# Patient Record
Sex: Female | Born: 1975 | Race: White | Hispanic: Yes | Marital: Single | Smoking: Never smoker
Health system: Southern US, Community
[De-identification: ages and names within clinical notes are randomized; demographics above are authoritative.]

## PROBLEM LIST (undated history)

## (undated) ENCOUNTER — Inpatient Hospital Stay (HOSPITAL_COMMUNITY): Payer: Self-pay

## (undated) DIAGNOSIS — Z309 Encounter for contraceptive management, unspecified: Secondary | ICD-10-CM

## (undated) DIAGNOSIS — B009 Herpesviral infection, unspecified: Secondary | ICD-10-CM

## (undated) DIAGNOSIS — Z331 Pregnant state, incidental: Secondary | ICD-10-CM

## (undated) DIAGNOSIS — N9089 Other specified noninflammatory disorders of vulva and perineum: Secondary | ICD-10-CM

## (undated) DIAGNOSIS — R87619 Unspecified abnormal cytological findings in specimens from cervix uteri: Secondary | ICD-10-CM

## (undated) DIAGNOSIS — O24419 Gestational diabetes mellitus in pregnancy, unspecified control: Secondary | ICD-10-CM

## (undated) DIAGNOSIS — B977 Papillomavirus as the cause of diseases classified elsewhere: Secondary | ICD-10-CM

## (undated) DIAGNOSIS — R102 Pelvic and perineal pain: Secondary | ICD-10-CM

## (undated) HISTORY — DX: Encounter for contraceptive management, unspecified: Z30.9

## (undated) HISTORY — DX: Pelvic and perineal pain: R10.2

## (undated) HISTORY — PX: CERVICAL BIOPSY: SHX590

## (undated) HISTORY — DX: Pregnant state, incidental: Z33.1

## (undated) HISTORY — PX: TONSILLECTOMY: SUR1361

## (undated) HISTORY — DX: Gestational diabetes mellitus in pregnancy, unspecified control: O24.419

## (undated) HISTORY — DX: Papillomavirus as the cause of diseases classified elsewhere: B97.7

## (undated) HISTORY — DX: Herpesviral infection, unspecified: B00.9

## (undated) HISTORY — DX: Unspecified abnormal cytological findings in specimens from cervix uteri: R87.619

## (undated) HISTORY — DX: Other specified noninflammatory disorders of vulva and perineum: N90.89

---

## 2001-03-25 ENCOUNTER — Other Ambulatory Visit: Admission: RE | Admit: 2001-03-25 | Discharge: 2001-03-25 | Payer: Self-pay | Admitting: Obstetrics and Gynecology

## 2001-08-18 ENCOUNTER — Encounter: Admission: RE | Admit: 2001-08-18 | Discharge: 2001-11-16 | Payer: Self-pay | Admitting: Obstetrics and Gynecology

## 2001-08-30 ENCOUNTER — Observation Stay (HOSPITAL_COMMUNITY): Admission: AD | Admit: 2001-08-30 | Discharge: 2001-08-31 | Payer: Self-pay | Admitting: Obstetrics and Gynecology

## 2001-09-26 ENCOUNTER — Ambulatory Visit (HOSPITAL_COMMUNITY): Admission: RE | Admit: 2001-09-26 | Discharge: 2001-09-26 | Payer: Self-pay | Admitting: Obstetrics and Gynecology

## 2001-09-29 ENCOUNTER — Ambulatory Visit (HOSPITAL_COMMUNITY): Admission: RE | Admit: 2001-09-29 | Discharge: 2001-09-29 | Payer: Self-pay | Admitting: Obstetrics and Gynecology

## 2001-10-12 ENCOUNTER — Inpatient Hospital Stay (HOSPITAL_COMMUNITY): Admission: RE | Admit: 2001-10-12 | Discharge: 2001-10-14 | Payer: Self-pay | Admitting: Obstetrics and Gynecology

## 2006-05-02 ENCOUNTER — Emergency Department (HOSPITAL_COMMUNITY): Admission: EM | Admit: 2006-05-02 | Discharge: 2006-05-02 | Payer: Self-pay | Admitting: Emergency Medicine

## 2006-05-03 ENCOUNTER — Emergency Department (HOSPITAL_COMMUNITY): Admission: EM | Admit: 2006-05-03 | Discharge: 2006-05-03 | Payer: Self-pay | Admitting: Emergency Medicine

## 2010-12-23 ENCOUNTER — Inpatient Hospital Stay (HOSPITAL_COMMUNITY): Admission: RE | Admit: 2010-12-23 | Payer: Self-pay | Source: Ambulatory Visit

## 2010-12-23 ENCOUNTER — Other Ambulatory Visit
Admission: RE | Admit: 2010-12-23 | Discharge: 2010-12-23 | Payer: Self-pay | Source: Home / Self Care | Admitting: Unknown Physician Specialty

## 2013-01-24 ENCOUNTER — Other Ambulatory Visit (HOSPITAL_COMMUNITY)
Admission: RE | Admit: 2013-01-24 | Discharge: 2013-01-24 | Disposition: A | Discharge status: Home / Self Care | Payer: Self-pay | Source: Ambulatory Visit | Attending: Unknown Physician Specialty | Admitting: Unknown Physician Specialty

## 2013-01-24 DIAGNOSIS — R87619 Unspecified abnormal cytological findings in specimens from cervix uteri: Secondary | ICD-10-CM | POA: Insufficient documentation

## 2013-01-24 DIAGNOSIS — Z01419 Encounter for gynecological examination (general) (routine) without abnormal findings: Secondary | ICD-10-CM | POA: Insufficient documentation

## 2013-09-30 HISTORY — PX: COLPOSCOPY: SHX161

## 2013-10-17 ENCOUNTER — Other Ambulatory Visit (HOSPITAL_COMMUNITY)
Admission: RE | Admit: 2013-10-17 | Discharge: 2013-10-17 | Disposition: A | Discharge status: Home / Self Care | Payer: Self-pay | Source: Ambulatory Visit | Attending: Unknown Physician Specialty | Admitting: Unknown Physician Specialty

## 2013-10-17 DIAGNOSIS — N72 Inflammatory disease of cervix uteri: Secondary | ICD-10-CM | POA: Insufficient documentation

## 2013-10-17 DIAGNOSIS — R87610 Atypical squamous cells of undetermined significance on cytologic smear of cervix (ASC-US): Secondary | ICD-10-CM | POA: Insufficient documentation

## 2014-01-12 ENCOUNTER — Encounter: Payer: Self-pay | Admitting: Adult Health

## 2014-01-12 ENCOUNTER — Encounter (INDEPENDENT_AMBULATORY_CARE_PROVIDER_SITE_OTHER): Payer: Self-pay

## 2014-01-12 ENCOUNTER — Ambulatory Visit (INDEPENDENT_AMBULATORY_CARE_PROVIDER_SITE_OTHER): Payer: 59 | Admitting: Adult Health

## 2014-01-12 VITALS — BP 90/68 | Ht 65.0 in | Wt 151.0 lb

## 2014-01-12 DIAGNOSIS — Z309 Encounter for contraceptive management, unspecified: Secondary | ICD-10-CM

## 2014-01-12 DIAGNOSIS — N9089 Other specified noninflammatory disorders of vulva and perineum: Secondary | ICD-10-CM

## 2014-01-12 DIAGNOSIS — B009 Herpesviral infection, unspecified: Secondary | ICD-10-CM

## 2014-01-12 HISTORY — DX: Encounter for contraceptive management, unspecified: Z30.9

## 2014-01-12 HISTORY — DX: Other specified noninflammatory disorders of vulva and perineum: N90.89

## 2014-01-12 MED ORDER — NORETHIN ACE-ETH ESTRAD-FE 1-20 MG-MCG(24) PO CHEW: 1.0000 | CHEWABLE_TABLET | Freq: Every day | ORAL | Status: DC

## 2014-01-12 MED ORDER — VALACYCLOVIR HCL 1 G PO TABS: ORAL_TABLET | ORAL | Status: DC

## 2014-01-12 NOTE — Progress Notes (Signed)
Subjective:     Patient ID: Jane Sherman, female   DOB: 12/09/1975, 38 y.o.   MRN: [id number]  HPI Jerene Dillingngrid is a 38 year old Hispanic female in complaining of left vulva irritation, she has history of abnormal pap, having been seen at health dept and had colpo there.She said there was a bump and she may have shaved over it, and has been putting neosporin on it.Has new partner for 2 months, and wants birth control, would like the pill.She has not smoked in over 5 years.  Review of Systems See HPI Reviewed past medical,surgical, social and family history. Reviewed medications and allergies.     Objective:   Physical Exam BP 90/68  Ht 5\' 5"  (1.651 m)  Wt 151 lb (68.493 kg)  BMI 25.13 kg/m2  LMP 12/19/2013 Skin warm and dry.Pelvic: external genitalia is normal in appearance, except for raw area left labia, herpes culture obtained, vagina: scant discharge without odor, cervix:smooth and bulbous, uterus: normal size, shape and contour, non tender, no masses felt, adnexa: no masses or tenderness noted.She wants to transfer records here from clinic.Discussed that herpes is virus and how it is spread.    Assessment:    Vulva irritation Herpes Contraceptive management    Plan:    Check HSV 2, and herpes culture Rx minastrin 1 daily start with next period, refill x 1 year USE condoms Rx valtrex 1 gm 1 bid x 10 days with 1 refill Review handout on herpes Follow up in 3 months for OC review

## 2014-01-12 NOTE — Patient Instructions (Signed)
Herpes Simplex Herpes simplex is generally classified as Type 1 or Type 2. Type 1 is generally the type that is responsible for cold sores. Type 2 is generally associated with sexually transmitted diseases. We now know that most of the thoughts on these viruses are inaccurate. We find that HSV1 is also present genitally and HSV2 can be present orally, but this will vary in different locations of the world. Herpes simplex is usually detected by doing a culture. Blood tests are also available for this virus; however, the accuracy is often not as good.  PREPARATION FOR TEST No preparation or fasting is necessary. NORMAL FINDINGS  No virus present  No HSV antigens or antibodies present Ranges for normal findings may vary among different laboratories and hospitals. You should always check with your doctor after having lab work or other tests done to discuss the meaning of your test results and whether your values are considered within normal limits. MEANING OF TEST  Your caregiver will go over the test results with you and discuss the importance and meaning of your results, as well as treatment options and the need for additional tests if necessary. OBTAINING THE TEST RESULTS  It is your responsibility to obtain your test results. Ask the lab or department performing the test when and how you will get your results. Document Released: 12/19/2004 Document Revised: 02/08/2012 Document Reviewed: 10/27/2008 [facility] Patient Information 2014 [place], [place]. Start birth control with next period Follow up in 3 months

## 2014-01-16 LAB — HSV 2 ANTIBODY, IGG: HSV 2 GLYCOPROTEIN G AB, IGG: 8.94 IV — AB

## 2014-01-17 ENCOUNTER — Telehealth: Payer: Self-pay | Admitting: Adult Health

## 2014-01-17 LAB — HERPES SIMPLEX VIRUS CULTURE: Organism ID, Bacteria: DETECTED

## 2014-01-17 NOTE — Telephone Encounter (Signed)
No voice mail box.

## 2014-01-18 ENCOUNTER — Telehealth: Payer: Self-pay | Admitting: Adult Health

## 2014-01-18 NOTE — Telephone Encounter (Signed)
Pt aware HSV 2 +antibodies and culture +HSV, feels better taking valtrex.

## 2014-04-06 ENCOUNTER — Ambulatory Visit: Payer: 59 | Admitting: Adult Health

## 2014-04-09 ENCOUNTER — Ambulatory Visit: Payer: 59 | Admitting: Adult Health

## 2014-04-09 ENCOUNTER — Encounter: Payer: Self-pay | Admitting: *Deleted

## 2014-04-17 ENCOUNTER — Ambulatory Visit (INDEPENDENT_AMBULATORY_CARE_PROVIDER_SITE_OTHER): Payer: 59 | Admitting: Adult Health

## 2014-04-17 ENCOUNTER — Encounter: Payer: Self-pay | Admitting: Adult Health

## 2014-04-17 VITALS — BP 100/60 | Ht 66.0 in | Wt 159.0 lb

## 2014-04-17 DIAGNOSIS — B009 Herpesviral infection, unspecified: Secondary | ICD-10-CM

## 2014-04-17 HISTORY — DX: Herpesviral infection, unspecified: B00.9

## 2014-04-17 MED ORDER — VALACYCLOVIR HCL 1 G PO TABS: ORAL_TABLET | ORAL | Status: DC

## 2014-04-17 NOTE — Patient Instructions (Signed)
Genital Herpes Genital herpes is a sexually transmitted disease. This means that it is a disease passed by having sex with an infected person. There is no cure for genital herpes. The time between attacks can be months to years. The virus may live in a person but produce no problems (symptoms). This infection can be passed to a baby as it travels down the birth canal (vagina). In a newborn, this can cause central nervous system damage, eye damage, or even death. The virus that causes genital herpes is usually HSV-2 virus. The virus that causes oral herpes is usually HSV-1. The diagnosis (learning what is wrong) is made through culture results. SYMPTOMS  Usually symptoms of pain and itching begin a few days to a week after contact. It first appears as small blisters that progress to small painful ulcers which then scab over and heal after several days. It affects the outer genitalia, birth canal, cervix, penis, anal area, buttocks, and thighs. HOME CARE INSTRUCTIONS   Keep ulcerated areas dry and clean.  Take medications as directed. Antiviral medications can speed up healing. They will not prevent recurrences or cure this infection. These medications can also be taken for suppression if there are frequent recurrences.  While the infection is active, it is contagious. Avoid all sexual contact during active infections.  Condoms may help prevent spread of the herpes virus.  Practice safe sex.  Wash your hands thoroughly after touching the genital area.  Avoid touching your eyes after touching your genital area.  Inform your caregiver if you have had genital herpes and become pregnant. It is your responsibility to insure a safe outcome for your baby in this pregnancy.  Only take over-the-counter or prescription medicines for pain, discomfort, or fever as directed by your caregiver. SEEK MEDICAL CARE IF:   You have a recurrence of this infection.  You do not respond to medications and are not  improving.  You have new sources of pain or discharge which have changed from the original infection.  You have an oral temperature above 102 F (38.9 C).  You develop abdominal pain.  You develop eye pain or signs of eye infection. Document Released: 11/13/2000 Document Revised: 02/08/2012 Document Reviewed: 12/04/2009 Exit[employer] Patient Information 2014 [employer]. TAKE FOLIC ACID Follow up in 6 months

## 2014-04-17 NOTE — Progress Notes (Signed)
Subjective:     Patient ID: Jane Sherman, female   DOB: 1976-06-05, 38 y.o.   MRN: [id number]  HPI Jane Sherman is a 38 year old Hispanic female in to discuss birth control.She stopped taking it, she thinks she wants a baby.  Review of Systems See  HPI Reviewed past medical,surgical, social and family history. Reviewed medications and allergies.     Objective:   Physical Exam BP 100/60  Ht 5\' 6"  (1.676 m)  Wt 159 lb (72.122 kg)  BMI 25.68 kg/m2  LMP 03/23/2014   Talk only, she stopped taking her OCs because she wants to get pregnant, has had outbreak of HSV recently, discussed taking valtrex daily and taking folic acid and timing of sex.   Assessment:    Herpes    Plan:    Rx valtrex 1 gm 1 daily #30 with prn refill Take folic acid Follow up in 6 months for pap and physical

## 2014-05-01 ENCOUNTER — Encounter: Payer: Self-pay | Admitting: *Deleted

## 2014-05-21 ENCOUNTER — Encounter: Payer: Self-pay | Admitting: Obstetrics and Gynecology

## 2014-05-22 ENCOUNTER — Telehealth: Payer: Self-pay | Admitting: *Deleted

## 2014-05-22 NOTE — Telephone Encounter (Signed)
Message copied by Criss AlvinePULLIAM, CHRYSTAL G on Tue May 22, 2014  8:39 AM ------      Message from: Tilda BurrowFERGUSON, JOHN V      Created: Mon May 21, 2014  7:58 PM       Ascus pap with + HPV , Colposcopy indicated ------

## 2014-07-03 ENCOUNTER — Ambulatory Visit: Payer: 59 | Admitting: Adult Health

## 2014-07-04 ENCOUNTER — Encounter: Payer: Self-pay | Admitting: Adult Health

## 2014-07-04 ENCOUNTER — Ambulatory Visit (INDEPENDENT_AMBULATORY_CARE_PROVIDER_SITE_OTHER): Payer: 59 | Admitting: Adult Health

## 2014-07-04 VITALS — BP 120/80 | Ht 65.0 in | Wt 162.0 lb

## 2014-07-04 DIAGNOSIS — Z331 Pregnant state, incidental: Secondary | ICD-10-CM

## 2014-07-04 DIAGNOSIS — Z3201 Encounter for pregnancy test, result positive: Secondary | ICD-10-CM

## 2014-07-04 DIAGNOSIS — R102 Pelvic and perineal pain: Secondary | ICD-10-CM

## 2014-07-04 DIAGNOSIS — N949 Unspecified condition associated with female genital organs and menstrual cycle: Secondary | ICD-10-CM

## 2014-07-04 DIAGNOSIS — Z3200 Encounter for pregnancy test, result unknown: Secondary | ICD-10-CM

## 2014-07-04 DIAGNOSIS — O26899 Other specified pregnancy related conditions, unspecified trimester: Secondary | ICD-10-CM

## 2014-07-04 HISTORY — DX: Pregnant state, incidental: Z33.1

## 2014-07-04 HISTORY — DX: Other specified pregnancy related conditions, unspecified trimester: O26.899

## 2014-07-04 HISTORY — DX: Pelvic and perineal pain: R10.2

## 2014-07-04 LAB — POCT URINE PREGNANCY: Preg Test, Ur: POSITIVE

## 2014-07-04 MED ORDER — PRENATAL PLUS 27-1 MG PO TABS: 1.0000 | ORAL_TABLET | Freq: Every day | ORAL | Status: DC

## 2014-07-04 NOTE — Patient Instructions (Signed)
First Trimester of Pregnancy The first trimester of pregnancy is from week 1 until the end of week 12 (months 1 through 3). A week after a sperm fertilizes an egg, the egg will implant on the wall of the uterus. This embryo will begin to develop into a baby. Genes from you and your partner are forming the baby. The female genes determine whether the baby is a boy or a girl. At 6-8 weeks, the eyes and face are formed, and the heartbeat can be seen on ultrasound. At the end of 12 weeks, all the baby's organs are formed.  Now that you are pregnant, you will want to do everything you can to have a healthy baby. Two of the most important things are to get good prenatal care and to follow your health care provider's instructions. Prenatal care is all the medical care you receive before the baby's birth. This care will help prevent, find, and treat any problems during the pregnancy and childbirth. BODY CHANGES Your body goes through many changes during pregnancy. The changes vary from woman to woman.   You may gain or lose a couple of pounds at first.  You may feel sick to your stomach (nauseous) and throw up (vomit). If the vomiting is uncontrollable, call your health care provider.  You may tire easily.  You may develop headaches that can be relieved by medicines approved by your health care provider.  You may urinate more often. Painful urination may mean you have a bladder infection.  You may develop heartburn as a result of your pregnancy.  You may develop constipation because certain hormones are causing the muscles that push waste through your intestines to slow down.  You may develop hemorrhoids or swollen, bulging veins (varicose veins).  Your breasts may begin to grow larger and become tender. Your nipples may stick out more, and the tissue that surrounds them (areola) may become darker.  Your gums may bleed and may be sensitive to brushing and flossing.  Dark spots or blotches (chloasma,  mask of pregnancy) may develop on your face. This will likely fade after the baby is born.  Your menstrual periods will stop.  You may have a loss of appetite.  You may develop cravings for certain kinds of food.  You may have changes in your emotions from day to day, such as being excited to be pregnant or being concerned that something may go wrong with the pregnancy and baby.  You may have more vivid and strange dreams.  You may have changes in your hair. These can include thickening of your hair, rapid growth, and changes in texture. Some women also have hair loss during or after pregnancy, or hair that feels dry or thin. Your hair will most likely return to normal after your baby is born. WHAT TO EXPECT AT YOUR PRENATAL VISITS During a routine prenatal visit:  You will be weighed to make sure you and the baby are growing normally.  Your blood pressure will be taken.  Your abdomen will be measured to track your baby's growth.  The fetal heartbeat will be listened to starting around week 10 or 12 of your pregnancy.  Test results from any previous visits will be discussed. Your health care provider may ask you:  How you are feeling.  If you are feeling the baby move.  If you have had any abnormal symptoms, such as leaking fluid, bleeding, severe headaches, or abdominal cramping.  If you have any questions. Other tests   that may be performed during your first trimester include:  Blood tests to find your blood type and to check for the presence of any previous infections. They will also be used to check for low iron levels (anemia) and Rh antibodies. Later in the pregnancy, blood tests for diabetes will be done along with other tests if problems develop.  Urine tests to check for infections, diabetes, or protein in the urine.  An ultrasound to confirm the proper growth and development of the baby.  An amniocentesis to check for possible genetic problems.  Fetal screens for  spina bifida and Down syndrome.  You may need other tests to make sure you and the baby are doing well. HOME CARE INSTRUCTIONS  Medicines  Follow your health care provider's instructions regarding medicine use. Specific medicines may be either safe or unsafe to take during pregnancy.  Take your prenatal vitamins as directed.  If you develop constipation, try taking a stool softener if your health care provider approves. Diet  Eat regular, well-balanced meals. Choose a variety of foods, such as meat or vegetable-based protein, fish, milk and low-fat dairy products, vegetables, fruits, and whole grain breads and cereals. Your health care provider will help you determine the amount of weight gain that is right for you.  Avoid raw meat and uncooked cheese. These carry germs that can cause birth defects in the baby.  Eating four or five small meals rather than three large meals a day may help relieve nausea and vomiting. If you start to feel nauseous, eating a few soda crackers can be helpful. Drinking liquids between meals instead of during meals also seems to help nausea and vomiting.  If you develop constipation, eat more high-fiber foods, such as fresh vegetables or fruit and whole grains. Drink enough fluids to keep your urine clear or pale yellow. Activity and Exercise  Exercise only as directed by your health care provider. Exercising will help you:  Control your weight.  Stay in shape.  Be prepared for labor and delivery.  Experiencing pain or cramping in the lower abdomen or low back is a good sign that you should stop exercising. Check with your health care provider before continuing normal exercises.  Try to avoid standing for long periods of time. Move your legs often if you must stand in one place for a long time.  Avoid heavy lifting.  Wear low-heeled shoes, and practice good posture.  You may continue to have sex unless your health care provider directs you  otherwise. Relief of Pain or Discomfort  Wear a good support bra for breast tenderness.   Take warm sitz baths to soothe any pain or discomfort caused by hemorrhoids. Use hemorrhoid cream if your health care provider approves.   Rest with your legs elevated if you have leg cramps or low back pain.  If you develop varicose veins in your legs, wear support hose. Elevate your feet for 15 minutes, 3-4 times a day. Limit salt in your diet. Prenatal Care  Schedule your prenatal visits by the twelfth week of pregnancy. They are usually scheduled monthly at first, then more often in the last 2 months before delivery.  Write down your questions. Take them to your prenatal visits.  Keep all your prenatal visits as directed by your health care provider. Safety  Wear your seat belt at all times when driving.  Make a list of emergency phone numbers, including numbers for family, friends, the hospital, and police and fire departments. General Tips    Ask your health care provider for a referral to a local prenatal education class. Begin classes no later than at the beginning of month 6 of your pregnancy.  Ask for help if you have counseling or nutritional needs during pregnancy. Your health care provider can offer advice or refer you to specialists for help with various needs.  Do not use hot tubs, steam rooms, or saunas.  Do not douche or use tampons or scented sanitary pads.  Do not cross your legs for long periods of time.  Avoid cat litter boxes and soil used by cats. These carry germs that can cause birth defects in the baby and possibly loss of the fetus by miscarriage or stillbirth.  Avoid all smoking, herbs, alcohol, and medicines not prescribed by your health care provider. Chemicals in these affect the formation and growth of the baby.  Schedule a dentist appointment. At home, brush your teeth with a soft toothbrush and be gentle when you floss. SEEK MEDICAL CARE IF:   You have  dizziness.  You have mild pelvic cramps, pelvic pressure, or nagging pain in the abdominal area.  You have persistent nausea, vomiting, or diarrhea.  You have a bad smelling vaginal discharge.  You have pain with urination.  You notice increased swelling in your face, hands, legs, or ankles. SEEK IMMEDIATE MEDICAL CARE IF:   You have a fever.  You are leaking fluid from your vagina.  You have spotting or bleeding from your vagina.  You have severe abdominal cramping or pain.  You have rapid weight gain or loss.  You vomit blood or material that looks like coffee grounds.  You are exposed to [place] measles and have never had them.  You are exposed to fifth disease or chickenpox.  You develop a severe headache.  You have shortness of breath.  You have any kind of trauma, such as from a fall or a car accident. Document Released: 11/10/2001 Document Revised: 04/02/2014 Document Reviewed: 09/26/2013 [facility]Exit[facility] Patient Information 2015 [place]ExitCare, [place]LLC. This information is not intended to replace advice given to you by your health care provider. Make sure you discuss any questions you have with your health care provider. No sex with pain Return in 2 weeks for US No lifting over 20 lbs

## 2014-07-04 NOTE — Progress Notes (Signed)
Subjective:     Patient ID: Jane Sherman, female   DOB: March 26, 1976, 38 y.o.   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  Review of Systems See HPI Reviewed past medical,surgical, social and family history. Reviewed medications and allergies.      Objective:   Physical Exam BP 120/80  Ht 5\' 5"  (1.651 m)  Wt 162 lb (73.483 kg)  BMI 26.96 kg/m2  LMP 07/01/2015UPT +. Skin warm and dry.Pelvic: external genitalia is normal in appearance, no lesions, vagina: white discharge without odor, no bleeding, cervix:smooth and bulbous, uterus: normal size, shape and contour, mildly tender, no masses felt, adnexa: no masses or tenderness noted. Discussed could be uterus stretching,but good that there is no bleeding.    Assessment:     Pelvic pain in pregnant pt., first trimester    Plan:    No sex while has pain, use tylenol for pain and increase fluids Rx prenatal plus #30 1 daily with 11 refills Check QHCG, call tomorrow for results Return in 2 weeks for dating US and see me Gave note to limit lifting to less than 20 lbs, OK to drive fork lift   Review handout on first tirmester

## 2014-07-05 ENCOUNTER — Telehealth: Payer: Self-pay | Admitting: Obstetrics & Gynecology

## 2014-07-05 LAB — HCG, QUANTITATIVE, PREGNANCY: HCG, BETA CHAIN, QUANT, S: 2085 m[IU]/mL

## 2014-07-05 NOTE — Telephone Encounter (Signed)
Pt aware of results. And advised to keep appointment that was already scheduled

## 2014-07-10 ENCOUNTER — Ambulatory Visit: Payer: 59 | Admitting: Adult Health

## 2014-07-11 ENCOUNTER — Telehealth: Payer: Self-pay | Admitting: Adult Health

## 2014-07-11 NOTE — Telephone Encounter (Signed)
Wants note to only work 48 hours per week, her work is going to 72 hours per week, 6 12 hour shifts, and pt is pregnant, will give note.

## 2014-07-13 ENCOUNTER — Encounter: Payer: Self-pay | Admitting: *Deleted

## 2014-07-13 NOTE — Progress Notes (Signed)
Patient ID: Jane Sherman, female   DOB: 30-Aug-1976, 38 y.o.   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 Per Cyril MourningJennifer Griffin, NP ok to give pt note stating only can work 48/12 hour shifts.

## 2014-07-17 ENCOUNTER — Encounter: Payer: 59 | Admitting: Adult Health

## 2014-07-17 ENCOUNTER — Other Ambulatory Visit: Payer: 59

## 2014-07-23 ENCOUNTER — Other Ambulatory Visit: Payer: Self-pay | Admitting: Adult Health

## 2014-07-23 ENCOUNTER — Ambulatory Visit: Payer: 59 | Admitting: Obstetrics and Gynecology

## 2014-07-23 ENCOUNTER — Encounter: Payer: 59 | Admitting: Adult Health

## 2014-07-23 ENCOUNTER — Ambulatory Visit (INDEPENDENT_AMBULATORY_CARE_PROVIDER_SITE_OTHER): Payer: 59

## 2014-07-23 ENCOUNTER — Encounter: Payer: Self-pay | Admitting: Adult Health

## 2014-07-23 DIAGNOSIS — R102 Pelvic and perineal pain: Secondary | ICD-10-CM

## 2014-07-23 DIAGNOSIS — Z331 Pregnant state, incidental: Secondary | ICD-10-CM

## 2014-07-23 DIAGNOSIS — Z1389 Encounter for screening for other disorder: Secondary | ICD-10-CM

## 2014-07-23 DIAGNOSIS — O26899 Other specified pregnancy related conditions, unspecified trimester: Secondary | ICD-10-CM

## 2014-07-23 DIAGNOSIS — N949 Unspecified condition associated with female genital organs and menstrual cycle: Secondary | ICD-10-CM

## 2014-07-23 NOTE — Progress Notes (Signed)
U/S-single IUP with +FCA noted, FHR-141 bpm, cx appears closed, bilateral adnexa appears WNL, CRL c/w 6+5wks EDD 03/13/2015

## 2014-07-30 ENCOUNTER — Encounter: Payer: Self-pay | Admitting: Women's Health

## 2014-07-30 ENCOUNTER — Ambulatory Visit (INDEPENDENT_AMBULATORY_CARE_PROVIDER_SITE_OTHER): Payer: 59 | Admitting: Women's Health

## 2014-07-30 VITALS — BP 110/72 | Wt 167.0 lb

## 2014-07-30 DIAGNOSIS — Z331 Pregnant state, incidental: Secondary | ICD-10-CM

## 2014-07-30 DIAGNOSIS — Z1389 Encounter for screening for other disorder: Secondary | ICD-10-CM

## 2014-07-30 DIAGNOSIS — O09529 Supervision of elderly multigravida, unspecified trimester: Secondary | ICD-10-CM | POA: Insufficient documentation

## 2014-07-30 DIAGNOSIS — O09899 Supervision of other high risk pregnancies, unspecified trimester: Secondary | ICD-10-CM | POA: Insufficient documentation

## 2014-07-30 DIAGNOSIS — Z3480 Encounter for supervision of other normal pregnancy, unspecified trimester: Secondary | ICD-10-CM

## 2014-07-30 DIAGNOSIS — O09521 Supervision of elderly multigravida, first trimester: Secondary | ICD-10-CM

## 2014-07-30 DIAGNOSIS — Z1159 Encounter for screening for other viral diseases: Secondary | ICD-10-CM

## 2014-07-30 DIAGNOSIS — O09291 Supervision of pregnancy with other poor reproductive or obstetric history, first trimester: Secondary | ICD-10-CM

## 2014-07-30 DIAGNOSIS — R768 Other specified abnormal immunological findings in serum: Secondary | ICD-10-CM

## 2014-07-30 DIAGNOSIS — Z36 Encounter for antenatal screening of mother: Secondary | ICD-10-CM

## 2014-07-30 DIAGNOSIS — B009 Herpesviral infection, unspecified: Secondary | ICD-10-CM | POA: Insufficient documentation

## 2014-07-30 DIAGNOSIS — Z8632 Personal history of gestational diabetes: Secondary | ICD-10-CM | POA: Insufficient documentation

## 2014-07-30 DIAGNOSIS — Z0184 Encounter for antibody response examination: Secondary | ICD-10-CM

## 2014-07-30 DIAGNOSIS — Z3481 Encounter for supervision of other normal pregnancy, first trimester: Secondary | ICD-10-CM

## 2014-07-30 DIAGNOSIS — Z8742 Personal history of other diseases of the female genital tract: Secondary | ICD-10-CM | POA: Insufficient documentation

## 2014-07-30 DIAGNOSIS — Z0189 Encounter for other specified special examinations: Secondary | ICD-10-CM

## 2014-07-30 DIAGNOSIS — Z130 Encounter for screening for diseases of the blood and blood-forming organs and certain disorders involving the immune mechanism: Secondary | ICD-10-CM

## 2014-07-30 DIAGNOSIS — Z1371 Encounter for nonprocreative screening for genetic disease carrier status: Secondary | ICD-10-CM

## 2014-07-30 LAB — POCT URINALYSIS DIPSTICK
Blood, UA: NEGATIVE
Glucose, UA: NEGATIVE
Ketones, UA: NEGATIVE
LEUKOCYTES UA: NEGATIVE
NITRITE UA: NEGATIVE
PROTEIN UA: NEGATIVE

## 2014-07-30 LAB — CBC
HEMATOCRIT: 39.4 % (ref 36.0–46.0)
HEMOGLOBIN: 13.2 g/dL (ref 12.0–15.0)
MCH: 31.4 pg (ref 26.0–34.0)
MCHC: 33.5 g/dL (ref 30.0–36.0)
MCV: 93.6 fL (ref 78.0–100.0)
Platelets: 261 10*3/uL (ref 150–400)
RBC: 4.21 MIL/uL (ref 3.87–5.11)
RDW: 13.6 % (ref 11.5–15.5)
WBC: 9.3 10*3/uL (ref 4.0–10.5)

## 2014-07-30 NOTE — Patient Instructions (Addendum)
You will have your sugar test next visit.  Please do not eat or drink anything after midnight the night before you come, not even water.  You will be here for at least two hours.     Nausea & Vomiting  Have saltine crackers or pretzels by your bed and eat a few bites before you raise your head out of bed in the morning  Eat small frequent meals throughout the day instead of large meals  Drink plenty of fluids throughout the day to stay hydrated, just don't drink a lot of fluids with your meals.  This can make your stomach fill up faster making you feel sick  Do not brush your teeth right after you eat  Products with real ginger are good for nausea, like ginger ale and ginger hard candy Make sure it says made with real ginger!  Sucking on sour candy like lemon heads is also good for nausea  If your prenatal vitamins make you nauseated, take them at night so you will sleep through the nausea  Sea Bands  If you feel like you need medicine for the nausea & vomiting please let us know  If you are unable to keep any fluids or food down please let us know    First Trimester of Pregnancy The first trimester of pregnancy is from week 1 until the end of week 12 (months 1 through 3). A week after a sperm fertilizes an egg, the egg will implant on the wall of the uterus. This embryo will begin to develop into a baby. Genes from you and your partner are forming the baby. The female genes determine whether the baby is a boy or a girl. At 6-8 weeks, the eyes and face are formed, and the heartbeat can be seen on ultrasound. At the end of 12 weeks, all the baby's organs are formed.  Now that you are pregnant, you will want to do everything you can to have a healthy baby. Two of the most important things are to get good prenatal care and to follow your health care provider's instructions. Prenatal care is all the medical care you receive before the baby's birth. This care will help prevent, find, and treat  any problems during the pregnancy and childbirth. BODY CHANGES Your body goes through many changes during pregnancy. The changes vary from woman to woman.   You may gain or lose a couple of pounds at first.  You may feel sick to your stomach (nauseous) and throw up (vomit). If the vomiting is uncontrollable, call your health care provider.  You may tire easily.  You may develop headaches that can be relieved by medicines approved by your health care provider.  You may urinate more often. Painful urination may mean you have a bladder infection.  You may develop heartburn as a result of your pregnancy.  You may develop constipation because certain hormones are causing the muscles that push waste through your intestines to slow down.  You may develop hemorrhoids or swollen, bulging veins (varicose veins).  Your breasts may begin to grow larger and become tender. Your nipples may stick out more, and the tissue that surrounds them (areola) may become darker.  Your gums may bleed and may be sensitive to brushing and flossing.  Dark spots or blotches (chloasma, mask of pregnancy) may develop on your face. This will likely fade after the baby is born.  Your menstrual periods will stop.  You may have a loss of appetite.    You may develop cravings for certain kinds of food.  You may have changes in your emotions from day to day, such as being excited to be pregnant or being concerned that something may go wrong with the pregnancy and baby.  You may have more vivid and strange dreams.  You may have changes in your hair. These can include thickening of your hair, rapid growth, and changes in texture. Some women also have hair loss during or after pregnancy, or hair that feels dry or thin. Your hair will most likely return to normal after your baby is born. WHAT TO EXPECT AT YOUR PRENATAL VISITS During a routine prenatal visit:  You will be weighed to make sure you and the baby are growing  normally.  Your blood pressure will be taken.  Your abdomen will be measured to track your baby's growth.  The fetal heartbeat will be listened to starting around week 10 or 12 of your pregnancy.  Test results from any previous visits will be discussed. Your health care provider may ask you:  How you are feeling.  If you are feeling the baby move.  If you have had any abnormal symptoms, such as leaking fluid, bleeding, severe headaches, or abdominal cramping.  If you have any questions. Other tests that may be performed during your first trimester include:  Blood tests to find your blood type and to check for the presence of any previous infections. They will also be used to check for low iron levels (anemia) and Rh antibodies. Later in the pregnancy, blood tests for diabetes will be done along with other tests if problems develop.  Urine tests to check for infections, diabetes, or protein in the urine.  An ultrasound to confirm the proper growth and development of the baby.  An amniocentesis to check for possible genetic problems.  Fetal screens for spina bifida and Down syndrome.  You may need other tests to make sure you and the baby are doing well. HOME CARE INSTRUCTIONS  Medicines  Follow your health care provider's instructions regarding medicine use. Specific medicines may be either safe or unsafe to take during pregnancy.  Take your prenatal vitamins as directed.  If you develop constipation, try taking a stool softener if your health care provider approves. Diet  Eat regular, well-balanced meals. Choose a variety of foods, such as meat or vegetable-based protein, fish, milk and low-fat dairy products, vegetables, fruits, and whole grain breads and cereals. Your health care provider will help you determine the amount of weight gain that is right for you.  Avoid raw meat and uncooked cheese. These carry germs that can cause birth defects in the baby.  Eating four  or five small meals rather than three large meals a day may help relieve nausea and vomiting. If you start to feel nauseous, eating a few soda crackers can be helpful. Drinking liquids between meals instead of during meals also seems to help nausea and vomiting.  If you develop constipation, eat more high-fiber foods, such as fresh vegetables or fruit and whole grains. Drink enough fluids to keep your urine clear or pale yellow. Activity and Exercise  Exercise only as directed by your health care provider. Exercising will help you:  Control your weight.  Stay in shape.  Be prepared for labor and delivery.  Experiencing pain or cramping in the lower abdomen or low back is a good sign that you should stop exercising. Check with your health care provider before continuing normal exercises.    Try to avoid standing for long periods of time. Move your legs often if you must stand in one place for a long time.  Avoid heavy lifting.  Wear low-heeled shoes, and practice good posture.  You may continue to have sex unless your health care provider directs you otherwise. Relief of Pain or Discomfort  Wear a good support bra for breast tenderness.   Take warm sitz baths to soothe any pain or discomfort caused by hemorrhoids. Use hemorrhoid cream if your health care provider approves.   Rest with your legs elevated if you have leg cramps or low back pain.  If you develop varicose veins in your legs, wear support hose. Elevate your feet for 15 minutes, 3-4 times a day. Limit salt in your diet. Prenatal Care  Schedule your prenatal visits by the twelfth week of pregnancy. They are usually scheduled monthly at first, then more often in the last 2 months before delivery.  Write down your questions. Take them to your prenatal visits.  Keep all your prenatal visits as directed by your health care provider. Safety  Wear your seat belt at all times when driving.  Make a list of emergency phone  numbers, including numbers for family, friends, the hospital, and police and fire departments. General Tips  Ask your health care provider for a referral to a local prenatal education class. Begin classes no later than at the beginning of month 6 of your pregnancy.  Ask for help if you have counseling or nutritional needs during pregnancy. Your health care provider can offer advice or refer you to specialists for help with various needs.  Do not use hot tubs, steam rooms, or saunas.  Do not douche or use tampons or scented sanitary pads.  Do not cross your legs for long periods of time.  Avoid cat litter boxes and soil used by cats. These carry germs that can cause birth defects in the baby and possibly loss of the fetus by miscarriage or stillbirth.  Avoid all smoking, herbs, alcohol, and medicines not prescribed by your health care provider. Chemicals in these affect the formation and growth of the baby.  Schedule a dentist appointment. At home, brush your teeth with a soft toothbrush and be gentle when you floss. SEEK MEDICAL CARE IF:   You have dizziness.  You have mild pelvic cramps, pelvic pressure, or nagging pain in the abdominal area.  You have persistent nausea, vomiting, or diarrhea.  You have a bad smelling vaginal discharge.  You have pain with urination.  You notice increased swelling in your face, hands, legs, or ankles. SEEK IMMEDIATE MEDICAL CARE IF:   You have a fever.  You are leaking fluid from your vagina.  You have spotting or bleeding from your vagina.  You have severe abdominal cramping or pain.  You have rapid weight gain or loss.  You vomit blood or material that looks like coffee grounds.  You are exposed to German measles and have never had them.  You are exposed to fifth disease or chickenpox.  You develop a severe headache.  You have shortness of breath.  You have any kind of trauma, such as from a fall or a car accident. Document  Released: 11/10/2001 Document Revised: 04/02/2014 Document Reviewed: 09/26/2013 [employer] Patient Information 2015 [employer]. This information is not intended to replace advice given to you by your health care provider. Make sure you discuss any questions you have with your health care provider.   

## 2014-07-30 NOTE — Progress Notes (Signed)
Subjective:  Jane Sherman is a 38 y.o. [phone] Hispanic female at [redacted]w[redacted]d by 6wk u/s, being seen today for her first obstetrical visit.  Her obstetrical history is significant for AMA, h/o A1DM prior pregnancy, known HSV2, abnormal pap.  Pregnancy history fully reviewed.  Patient reports some nause- no vomiting- denies needs for meds at this time. Denies vb, cramping, uti s/s, abnormal/malodorous vag d/c, or vulvovaginal itching/irritation.  BP 110/72  Wt 167 lb (75.751 kg)  LMP 05/30/2014  HISTORY: OB History  Gravida Para Term Preterm AB SAB TAB Ectopic Multiple Living  0 2 2 0 0 0 1    # Outcome Date GA Lbr Len/2nd Weight Sex Delivery Anes PTL Lv  4 CUR           3 SAB 04/30/12 [redacted]w[redacted]d         2 SAB 06/30/10 [redacted]w[redacted]d         1 TRM 10/12/01 [redacted]w[redacted]d  8 lb 9 oz (3.884 kg) F SVD  N Y     Past Medical History  Diagnosis Date  . Abnormal Pap smear of cervix   . HPV (human papilloma virus) infection   . Gestational diabetes   . Labial irritation 01/12/2014    ?herpes  . Contraceptive management 01/12/2014  . Herpes 04/17/2014  . Pregnant state, incidental 07/04/2014  . Pelvic pain in pregnant patient at less than [redacted] weeks gestation 07/04/2014   Past Surgical History  Procedure Laterality Date  . Colposcopy  09/2013  . Cervical biopsy     Family History  Problem Relation Age of Onset  . Diabetes Mother   . Diabetes Maternal Grandmother     Exam   System:     General: Well developed & nourished, no acute distress   Skin: Warm & dry, normal coloration and turgor, no rashes   Neurologic: Alert & oriented, normal mood   Cardiovascular: Regular rate & rhythm   Respiratory: Effort & rate normal, LCTAB, acyanotic   Abdomen: Soft, non tender   Extremities: normal strength, tone   Pelvic Exam:    Perineum: Normal perineum   Vulva: Normal, no lesions   Vagina:  Normal mucosa, normal discharge   Cervix: Normal, bulbous, appears closed   Uterus: Normal size/shape/contour for  GA   Thin prep pap smear not obtained today, will need after 10/17/14 (85yr from last pap)  FHR: 170 via informal transabdominal u/s   Assessment:   Pregnancy: [id number] Patient Active Problem List   Diagnosis Date Noted  . Supervision of other normal pregnancy 07/30/2014    Priority: High  . 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 History of gestational diabetes in prior pregnancy, currently pregnant 07/30/2014    Priority: High  . History of abnormal cervical Pap smear 07/30/2014    Priority: High  . Pelvic pain in pregnant patient at less than [redacted] weeks gestation 07/04/2014  . Labial irritation 01/12/2014    [redacted]w[redacted]d [id number] New OB visit AMA H/O A1DM Known HSV2 LSIL/HPV pap 2014    Plan:  Initial labs drawn Continue prenatal vitamins Problem list reviewed and updated Reviewed n/v relief measures and warning s/s to report Reviewed recommended weight gain based on pre-gravid BMI Encouraged well-balanced diet Genetic Screening discussed Integrated Screen: requested Cystic fibrosis screening discussed requested Ultrasound discussed; fetal survey: requested Follow up in 1 weeks for early 2hr gtt, then 4wks for 1st it/nt and lrob CCNC completed Pap after 10/17/14  Marge Duncans CNM,  WHNP-BC 07/30/2014 10:15 AM

## 2014-07-31 LAB — URINALYSIS, ROUTINE W REFLEX MICROSCOPIC
BILIRUBIN URINE: NEGATIVE
Glucose, UA: NEGATIVE mg/dL
HGB URINE DIPSTICK: NEGATIVE
KETONES UR: NEGATIVE mg/dL
Leukocytes, UA: NEGATIVE
Nitrite: NEGATIVE
PH: 6 (ref 5.0–8.0)
Protein, ur: NEGATIVE mg/dL
SPECIFIC GRAVITY, URINE: 1.013 (ref 1.005–1.030)
UROBILINOGEN UA: 0.2 mg/dL (ref 0.0–1.0)

## 2014-07-31 LAB — DRUG SCREEN, URINE, NO CONFIRMATION
Amphetamine Screen, Ur: NEGATIVE
BARBITURATE QUANT UR: NEGATIVE
Benzodiazepines.: NEGATIVE
COCAINE METABOLITES: NEGATIVE
CREATININE, U: 118.8 mg/dL
METHADONE: NEGATIVE
Marijuana Metabolite: NEGATIVE
OPIATE SCREEN, URINE: NEGATIVE
Phencyclidine (PCP): NEGATIVE
Propoxyphene: NEGATIVE

## 2014-07-31 LAB — VARICELLA ZOSTER ANTIBODY, IGG: VARICELLA IGG: 943.6 {index} — AB (ref <135.00)

## 2014-07-31 LAB — RUBELLA SCREEN: RUBELLA: 6.73 {index} — AB (ref <0.90)

## 2014-07-31 LAB — GC/CHLAMYDIA PROBE AMP
CT PROBE, AMP APTIMA: NEGATIVE
GC Probe RNA: NEGATIVE

## 2014-07-31 LAB — OXYCODONE SCREEN, UA, RFLX CONFIRM: OXYCODONE SCRN UR: NEGATIVE ng/mL

## 2014-07-31 LAB — SICKLE CELL SCREEN: SICKLE CELL SCREEN: NEGATIVE

## 2014-07-31 LAB — RPR

## 2014-07-31 LAB — HEPATITIS B SURFACE ANTIGEN: Hepatitis B Surface Ag: NEGATIVE

## 2014-07-31 LAB — ABO AND RH: RH TYPE: POSITIVE

## 2014-07-31 LAB — HIV ANTIBODY (ROUTINE TESTING W REFLEX): HIV 1&2 Ab, 4th Generation: NONREACTIVE

## 2014-07-31 LAB — ANTIBODY SCREEN: ANTIBODY SCREEN: NEGATIVE

## 2014-08-01 ENCOUNTER — Encounter: Payer: Self-pay | Admitting: Women's Health

## 2014-08-01 LAB — URINE CULTURE
Colony Count: NO GROWTH
ORGANISM ID, BACTERIA: NO GROWTH

## 2014-08-01 LAB — CYSTIC FIBROSIS DIAGNOSTIC STUDY

## 2014-08-04 ENCOUNTER — Encounter: Payer: Self-pay | Admitting: Women's Health

## 2014-08-07 ENCOUNTER — Other Ambulatory Visit: Payer: 59

## 2014-08-07 DIAGNOSIS — O09291 Supervision of pregnancy with other poor reproductive or obstetric history, first trimester: Secondary | ICD-10-CM

## 2014-08-07 DIAGNOSIS — Z8632 Personal history of gestational diabetes: Principal | ICD-10-CM

## 2014-08-08 ENCOUNTER — Encounter: Payer: Self-pay | Admitting: Women's Health

## 2014-08-08 LAB — GLUCOSE TOLERANCE, 2 HOURS W/ 1HR
GLUCOSE, FASTING: 79 mg/dL (ref 70–99)
Glucose, 1 hour: 159 mg/dL (ref 70–170)
Glucose, 2 hour: 141 mg/dL — ABNORMAL HIGH (ref 70–139)

## 2014-08-27 ENCOUNTER — Ambulatory Visit (INDEPENDENT_AMBULATORY_CARE_PROVIDER_SITE_OTHER): Payer: 59 | Admitting: Women's Health

## 2014-08-27 ENCOUNTER — Ambulatory Visit (INDEPENDENT_AMBULATORY_CARE_PROVIDER_SITE_OTHER): Payer: 59

## 2014-08-27 ENCOUNTER — Encounter: Payer: Self-pay | Admitting: Women's Health

## 2014-08-27 VITALS — BP 116/72 | Temp 98.1°F | Wt 174.0 lb

## 2014-08-27 DIAGNOSIS — Z36 Encounter for antenatal screening of mother: Secondary | ICD-10-CM

## 2014-08-27 DIAGNOSIS — Z331 Pregnant state, incidental: Secondary | ICD-10-CM

## 2014-08-27 DIAGNOSIS — O09521 Supervision of elderly multigravida, first trimester: Secondary | ICD-10-CM

## 2014-08-27 DIAGNOSIS — O09529 Supervision of elderly multigravida, unspecified trimester: Secondary | ICD-10-CM

## 2014-08-27 DIAGNOSIS — Z3481 Encounter for supervision of other normal pregnancy, first trimester: Secondary | ICD-10-CM

## 2014-08-27 DIAGNOSIS — Z1389 Encounter for screening for other disorder: Secondary | ICD-10-CM

## 2014-08-27 DIAGNOSIS — Z3480 Encounter for supervision of other normal pregnancy, unspecified trimester: Secondary | ICD-10-CM

## 2014-08-27 LAB — POCT URINALYSIS DIPSTICK
Glucose, UA: NEGATIVE
KETONES UA: NEGATIVE
Leukocytes, UA: NEGATIVE
Nitrite, UA: NEGATIVE
Protein, UA: NEGATIVE
RBC UA: NEGATIVE

## 2014-08-27 NOTE — Patient Instructions (Signed)
First Trimester of Pregnancy The first trimester of pregnancy is from week 1 until the end of week 12 (months 1 through 3). A week after a sperm fertilizes an egg, the egg will implant on the wall of the uterus. This embryo will begin to develop into a baby. Genes from you and your partner are forming the baby. The female genes determine whether the baby is a boy or a girl. At 6-8 weeks, the eyes and face are formed, and the heartbeat can be seen on ultrasound. At the end of 12 weeks, all the baby's organs are formed.  Now that you are pregnant, you will want to do everything you can to have a healthy baby. Two of the most important things are to get good prenatal care and to follow your health care provider's instructions. Prenatal care is all the medical care you receive before the baby's birth. This care will help prevent, find, and treat any problems during the pregnancy and childbirth. BODY CHANGES Your body goes through many changes during pregnancy. The changes vary from woman to woman.   You may gain or lose a couple of pounds at first.  You may feel sick to your stomach (nauseous) and throw up (vomit). If the vomiting is uncontrollable, call your health care provider.  You may tire easily.  You may develop headaches that can be relieved by medicines approved by your health care provider.  You may urinate more often. Painful urination may mean you have a bladder infection.  You may develop heartburn as a result of your pregnancy.  You may develop constipation because certain hormones are causing the muscles that push waste through your intestines to slow down.  You may develop hemorrhoids or swollen, bulging veins (varicose veins).  Your breasts may begin to grow larger and become tender. Your nipples may stick out more, and the tissue that surrounds them (areola) may become darker.  Your gums may bleed and may be sensitive to brushing and flossing.  Dark spots or blotches (chloasma,  mask of pregnancy) may develop on your face. This will likely fade after the baby is born.  Your menstrual periods will stop.  You may have a loss of appetite.  You may develop cravings for certain kinds of food.  You may have changes in your emotions from day to day, such as being excited to be pregnant or being concerned that something may go wrong with the pregnancy and baby.  You may have more vivid and strange dreams.  You may have changes in your hair. These can include thickening of your hair, rapid growth, and changes in texture. Some women also have hair loss during or after pregnancy, or hair that feels dry or thin. Your hair will most likely return to normal after your baby is born. WHAT TO EXPECT AT YOUR PRENATAL VISITS During a routine prenatal visit:  You will be weighed to make sure you and the baby are growing normally.  Your blood pressure will be taken.  Your abdomen will be measured to track your baby's growth.  The fetal heartbeat will be listened to starting around week 10 or 12 of your pregnancy.  Test results from any previous visits will be discussed. Your health care provider may ask you:  How you are feeling.  If you are feeling the baby move.  If you have had any abnormal symptoms, such as leaking fluid, bleeding, severe headaches, or abdominal cramping.  If you have any questions. Other tests   that may be performed during your first trimester include:  Blood tests to find your blood type and to check for the presence of any previous infections. They will also be used to check for low iron levels (anemia) and Rh antibodies. Later in the pregnancy, blood tests for diabetes will be done along with other tests if problems develop.  Urine tests to check for infections, diabetes, or protein in the urine.  An ultrasound to confirm the proper growth and development of the baby.  An amniocentesis to check for possible genetic problems.  Fetal screens for  spina bifida and Down syndrome.  You may need other tests to make sure you and the baby are doing well. HOME CARE INSTRUCTIONS  Medicines  Follow your health care provider's instructions regarding medicine use. Specific medicines may be either safe or unsafe to take during pregnancy.  Take your prenatal vitamins as directed.  If you develop constipation, try taking a stool softener if your health care provider approves. Diet  Eat regular, well-balanced meals. Choose a variety of foods, such as meat or vegetable-based protein, fish, milk and low-fat dairy products, vegetables, fruits, and whole grain breads and cereals. Your health care provider will help you determine the amount of weight gain that is right for you.  Avoid raw meat and uncooked cheese. These carry germs that can cause birth defects in the baby.  Eating four or five small meals rather than three large meals a day may help relieve nausea and vomiting. If you start to feel nauseous, eating a few soda crackers can be helpful. Drinking liquids between meals instead of during meals also seems to help nausea and vomiting.  If you develop constipation, eat more high-fiber foods, such as fresh vegetables or fruit and whole grains. Drink enough fluids to keep your urine clear or pale yellow. Activity and Exercise  Exercise only as directed by your health care provider. Exercising will help you:  Control your weight.  Stay in shape.  Be prepared for labor and delivery.  Experiencing pain or cramping in the lower abdomen or low back is a good sign that you should stop exercising. Check with your health care provider before continuing normal exercises.  Try to avoid standing for long periods of time. Move your legs often if you must stand in one place for a long time.  Avoid heavy lifting.  Wear low-heeled shoes, and practice good posture.  You may continue to have sex unless your health care provider directs you  otherwise. Relief of Pain or Discomfort  Wear a good support bra for breast tenderness.   Take warm sitz baths to soothe any pain or discomfort caused by hemorrhoids. Use hemorrhoid cream if your health care provider approves.   Rest with your legs elevated if you have leg cramps or low back pain.  If you develop varicose veins in your legs, wear support hose. Elevate your feet for 15 minutes, 3-4 times a day. Limit salt in your diet. Prenatal Care  Schedule your prenatal visits by the twelfth week of pregnancy. They are usually scheduled monthly at first, then more often in the last 2 months before delivery.  Write down your questions. Take them to your prenatal visits.  Keep all your prenatal visits as directed by your health care provider. Safety  Wear your seat belt at all times when driving.  Make a list of emergency phone numbers, including numbers for family, friends, the hospital, and police and fire departments. General Tips    Ask your health care provider for a referral to a local prenatal education class. Begin classes no later than at the beginning of month 6 of your pregnancy.  Ask for help if you have counseling or nutritional needs during pregnancy. Your health care provider can offer advice or refer you to specialists for help with various needs.  Do not use hot tubs, steam rooms, or saunas.  Do not douche or use tampons or scented sanitary pads.  Do not cross your legs for long periods of time.  Avoid cat litter boxes and soil used by cats. These carry germs that can cause birth defects in the baby and possibly loss of the fetus by miscarriage or stillbirth.  Avoid all smoking, herbs, alcohol, and medicines not prescribed by your health care provider. Chemicals in these affect the formation and growth of the baby.  Schedule a dentist appointment. At home, brush your teeth with a soft toothbrush and be gentle when you floss. SEEK MEDICAL CARE IF:   You have  dizziness.  You have mild pelvic cramps, pelvic pressure, or nagging pain in the abdominal area.  You have persistent nausea, vomiting, or diarrhea.  You have a bad smelling vaginal discharge.  You have pain with urination.  You notice increased swelling in your face, hands, legs, or ankles. SEEK IMMEDIATE MEDICAL CARE IF:   You have a fever.  You are leaking fluid from your vagina.  You have spotting or bleeding from your vagina.  You have severe abdominal cramping or pain.  You have rapid weight gain or loss.  You vomit blood or material that looks like coffee grounds.  You are exposed to German measles and have never had them.  You are exposed to fifth disease or chickenpox.  You develop a severe headache.  You have shortness of breath.  You have any kind of trauma, such as from a fall or a car accident. Document Released: 11/10/2001 Document Revised: 04/02/2014 Document Reviewed: 09/26/2013 [employer] Patient Information 2015 [employer]. This information is not intended to replace advice given to you by your health care provider. Make sure you discuss any questions you have with your health care provider.  

## 2014-08-27 NOTE — Progress Notes (Signed)
U/S(11+5wks)-single IUP with +FCA noted, CRL c/w u/s dates, cx appears closed, posterior Gr 0 placenta, bilateral adnexa appears WNL, FHR-148 bpm, NB present, NT-1.30mm

## 2014-08-27 NOTE — Progress Notes (Signed)
Low-risk OB appointment [id number] [redacted]w[redacted]d Estimated Date of Delivery: 03/13/15 BP 116/72  Temp(Src) 98.1 F (36.7 C)  Wt 174 lb (78.926 kg)  LMP 05/30/2014  BP, weight, and urine reviewed.  Refer to obstetrical flow sheet for FH & FHR.  No fm yet. Denies cramping, lof, vb, or uti s/s. Cold since Thurs pm- had fever Thurs, none since, mild sore throat, congestion- called nurse line who recommended humidifier which has been helping. Feels like she is getting better.  No acute distress, appears well, oropharynx w/o erythema/exudate 14lb wt gain of 15-25lb total recommendation, advised decreasing carbs- early 2hr gtt was normal Reviewed today's normal NT u/s, warning s/s to report. Call if cold sx worsen Plan:  Continue routine obstetrical care  F/U in 4wks for OB appointment and 2nd IT

## 2014-08-31 LAB — MATERNAL SCREEN, INTEGRATED #1

## 2014-09-24 ENCOUNTER — Ambulatory Visit (INDEPENDENT_AMBULATORY_CARE_PROVIDER_SITE_OTHER): Payer: 59 | Admitting: Women's Health

## 2014-09-24 ENCOUNTER — Encounter: Payer: Self-pay | Admitting: Women's Health

## 2014-09-24 VITALS — BP 96/50 | Wt 181.0 lb

## 2014-09-24 DIAGNOSIS — Z3482 Encounter for supervision of other normal pregnancy, second trimester: Secondary | ICD-10-CM

## 2014-09-24 DIAGNOSIS — Z3492 Encounter for supervision of normal pregnancy, unspecified, second trimester: Secondary | ICD-10-CM

## 2014-09-24 DIAGNOSIS — Z363 Encounter for antenatal screening for malformations: Secondary | ICD-10-CM

## 2014-09-24 DIAGNOSIS — Z23 Encounter for immunization: Secondary | ICD-10-CM

## 2014-09-24 DIAGNOSIS — Z331 Pregnant state, incidental: Secondary | ICD-10-CM

## 2014-09-24 DIAGNOSIS — Z1389 Encounter for screening for other disorder: Secondary | ICD-10-CM

## 2014-09-24 DIAGNOSIS — Z3682 Encounter for antenatal screening for nuchal translucency: Secondary | ICD-10-CM

## 2014-09-24 DIAGNOSIS — O09522 Supervision of elderly multigravida, second trimester: Secondary | ICD-10-CM | POA: Diagnosis not present

## 2014-09-24 LAB — POCT URINALYSIS DIPSTICK
Blood, UA: NEGATIVE
GLUCOSE UA: NEGATIVE
KETONES UA: NEGATIVE
Leukocytes, UA: NEGATIVE
Nitrite, UA: NEGATIVE
Protein, UA: NEGATIVE

## 2014-09-24 NOTE — Progress Notes (Signed)
Low-risk OB appointment [id number] Estimated Date of Delivery: 03/13/15 BP 96/50  Wt 181 lb (82.101 kg)  LMP 05/30/2014  BP, weight,reviewed.  Unable to void, will try again before she leaves. Refer to obstetrical flow sheet for FH & FHR.  No fm yet. Denies cramping, lof, vb, or uti s/s. No complaints. Drinks camomile and green tea, heard it may not be safe. OK for green tea, to stop camomile.  Reviewed ptl s/s. Plan:  Continue routine obstetrical care  F/U in 4wks for OB appointment/pap and anatomy u/s 2nd IT today Flu shot today

## 2014-09-24 NOTE — Patient Instructions (Signed)
Second Trimester of Pregnancy The second trimester is from week 13 through week 28, months 4 through 6. The second trimester is often a time when you feel your best. Your body has also adjusted to being pregnant, and you begin to feel better physically. Usually, morning sickness has lessened or quit completely, you may have more energy, and you may have an increase in appetite. The second trimester is also a time when the fetus is growing rapidly. At the end of the sixth month, the fetus is about 9 inches long and weighs about 1 pounds. You will likely begin to feel the baby move (quickening) between 18 and 20 weeks of the pregnancy. BODY CHANGES Your body goes through many changes during pregnancy. The changes vary from woman to woman.   Your weight will continue to increase. You will notice your lower abdomen bulging out.  You may begin to get stretch marks on your hips, abdomen, and breasts.  You may develop headaches that can be relieved by medicines approved by your health care provider.  You may urinate more often because the fetus is pressing on your bladder.  You may develop or continue to have heartburn as a result of your pregnancy.  You may develop constipation because certain hormones are causing the muscles that push waste through your intestines to slow down.  You may develop hemorrhoids or swollen, bulging veins (varicose veins).  You may have back pain because of the weight gain and pregnancy hormones relaxing your joints between the bones in your pelvis and as a result of a shift in weight and the muscles that support your balance.  Your breasts will continue to grow and be tender.  Your gums may bleed and may be sensitive to brushing and flossing.  Dark spots or blotches (chloasma, mask of pregnancy) may develop on your face. This will likely fade after the baby is born.  A dark line from your belly button to the pubic area (linea nigra) may appear. This will likely fade  after the baby is born.  You may have changes in your hair. These can include thickening of your hair, rapid growth, and changes in texture. Some women also have hair loss during or after pregnancy, or hair that feels dry or thin. Your hair will most likely return to normal after your baby is born. WHAT TO EXPECT AT YOUR PRENATAL VISITS During a routine prenatal visit:  You will be weighed to make sure you and the fetus are growing normally.  Your blood pressure will be taken.  Your abdomen will be measured to track your baby's growth.  The fetal heartbeat will be listened to.  Any test results from the previous visit will be discussed. Your health care provider may ask you:  How you are feeling.  If you are feeling the baby move.  If you have had any abnormal symptoms, such as leaking fluid, bleeding, severe headaches, or abdominal cramping.  If you have any questions. Other tests that may be performed during your second trimester include:  Blood tests that check for:  Low iron levels (anemia).  Gestational diabetes (between 24 and 28 weeks).  Rh antibodies.  Urine tests to check for infections, diabetes, or protein in the urine.  An ultrasound to confirm the proper growth and development of the baby.  An amniocentesis to check for possible genetic problems.  Fetal screens for spina bifida and Down syndrome. HOME CARE INSTRUCTIONS   Avoid all smoking, herbs, alcohol, and unprescribed   drugs. These chemicals affect the formation and growth of the baby.  Follow your health care provider's instructions regarding medicine use. There are medicines that are either safe or unsafe to take during pregnancy.  Exercise only as directed by your health care provider. Experiencing uterine cramps is a good sign to stop exercising.  Continue to eat regular, healthy meals.  Wear a good support bra for breast tenderness.  Do not use hot tubs, steam rooms, or saunas.  Wear your  seat belt at all times when driving.  Avoid raw meat, uncooked cheese, cat litter boxes, and soil used by cats. These carry germs that can cause birth defects in the baby.  Take your prenatal vitamins.  Try taking a stool softener (if your health care provider approves) if you develop constipation. Eat more high-fiber foods, such as fresh vegetables or fruit and whole grains. Drink plenty of fluids to keep your urine clear or pale yellow.  Take warm sitz baths to soothe any pain or discomfort caused by hemorrhoids. Use hemorrhoid cream if your health care provider approves.  If you develop varicose veins, wear support hose. Elevate your feet for 15 minutes, 3-4 times a day. Limit salt in your diet.  Avoid heavy lifting, wear low heel shoes, and practice good posture.  Rest with your legs elevated if you have leg cramps or low back pain.  Visit your dentist if you have not gone yet during your pregnancy. Use a soft toothbrush to brush your teeth and be gentle when you floss.  A sexual relationship may be continued unless your health care provider directs you otherwise.  Continue to go to all your prenatal visits as directed by your health care provider. SEEK MEDICAL CARE IF:   You have dizziness.  You have mild pelvic cramps, pelvic pressure, or nagging pain in the abdominal area.  You have persistent nausea, vomiting, or diarrhea.  You have a bad smelling vaginal discharge.  You have pain with urination. SEEK IMMEDIATE MEDICAL CARE IF:   You have a fever.  You are leaking fluid from your vagina.  You have spotting or bleeding from your vagina.  You have severe abdominal cramping or pain.  You have rapid weight gain or loss.  You have shortness of breath with chest pain.  You notice sudden or extreme swelling of your face, hands, ankles, feet, or legs.  You have not felt your baby move in over an hour.  You have severe headaches that do not go away with  medicine.  You have vision changes. Document Released: 11/10/2001 Document Revised: 11/21/2013 Document Reviewed: 01/17/2013 [employer] Patient Information 2015 [employer]. This information is not intended to replace advice given to you by your health care provider. Make sure you discuss any questions you have with your health care provider.  

## 2014-09-27 LAB — MATERNAL SCREEN, INTEGRATED #2
AFP MoM: 1.08
AFP, Serum: 30.4 ng/mL
CROWN RUMP LENGTH MAT SCREEN 2: 58.9 mm
Calculated Gestational Age: 16.3
ESTRIOL MOM MAT SCREEN: 0.69
Estriol, Free: 0.6 ng/mL
HCG, SERUM MAT SCREEN: 28.2 [IU]/mL
INHIBIN A DIMERIC MAT SCREEN: 107 pg/mL
Inhibin A MoM: 0.72
NT MoM: 1.39
Nuchal Translucency: 1.9 mm
Number of fetuses: 1
PAPP-A MOM MAT SCREEN: 1.49
PAPP-A: 708 ng/mL
hCG MoM: 0.94

## 2014-10-01 ENCOUNTER — Encounter: Payer: Self-pay | Admitting: Women's Health

## 2014-10-01 DIAGNOSIS — Z029 Encounter for administrative examinations, unspecified: Secondary | ICD-10-CM

## 2014-10-09 ENCOUNTER — Ambulatory Visit (INDEPENDENT_AMBULATORY_CARE_PROVIDER_SITE_OTHER): Payer: 59 | Admitting: Obstetrics & Gynecology

## 2014-10-09 VITALS — BP 120/80 | Wt 182.0 lb

## 2014-10-09 DIAGNOSIS — Z3482 Encounter for supervision of other normal pregnancy, second trimester: Secondary | ICD-10-CM

## 2014-10-09 NOTE — Progress Notes (Signed)
[id number] Estimated Date of Delivery: 03/13/15  Blood pressure 120/80, weight 182 lb (82.555 kg), last menstrual period 05/30/2014.   BP weight and urine results all reviewed and noted.  Please refer to the obstetrical flow sheet for the fundal height and fetal heart rate documentation:  Patient reports good fetal movement, denies any bleeding and no rupture of membranes symptoms or regular contractions. Patient is without complaints. All questions were answered.  Plan:  Continued routine obstetrical care,   Follow up in keep  weeks for OB appointment, sonogram  Round ligament pain

## 2014-10-22 ENCOUNTER — Ambulatory Visit (INDEPENDENT_AMBULATORY_CARE_PROVIDER_SITE_OTHER): Payer: 59

## 2014-10-22 ENCOUNTER — Ambulatory Visit (INDEPENDENT_AMBULATORY_CARE_PROVIDER_SITE_OTHER): Payer: 59 | Admitting: Women's Health

## 2014-10-22 ENCOUNTER — Other Ambulatory Visit: Payer: Self-pay | Admitting: Women's Health

## 2014-10-22 ENCOUNTER — Encounter: Payer: Self-pay | Admitting: Women's Health

## 2014-10-22 VITALS — BP 104/70 | Wt 185.0 lb

## 2014-10-22 DIAGNOSIS — O4420 Partial placenta previa NOS or without hemorrhage, unspecified trimester: Secondary | ICD-10-CM

## 2014-10-22 DIAGNOSIS — Z1389 Encounter for screening for other disorder: Secondary | ICD-10-CM

## 2014-10-22 DIAGNOSIS — O09522 Supervision of elderly multigravida, second trimester: Secondary | ICD-10-CM

## 2014-10-22 DIAGNOSIS — O09292 Supervision of pregnancy with other poor reproductive or obstetric history, second trimester: Secondary | ICD-10-CM

## 2014-10-22 DIAGNOSIS — O4402 Placenta previa specified as without hemorrhage, second trimester: Secondary | ICD-10-CM

## 2014-10-22 DIAGNOSIS — Z331 Pregnant state, incidental: Secondary | ICD-10-CM

## 2014-10-22 DIAGNOSIS — O4400 Placenta previa specified as without hemorrhage, unspecified trimester: Secondary | ICD-10-CM

## 2014-10-22 DIAGNOSIS — O4412 Placenta previa with hemorrhage, second trimester: Secondary | ICD-10-CM

## 2014-10-22 DIAGNOSIS — Z363 Encounter for antenatal screening for malformations: Secondary | ICD-10-CM

## 2014-10-22 LAB — POCT URINALYSIS DIPSTICK
GLUCOSE UA: NEGATIVE
Ketones, UA: NEGATIVE
Leukocytes, UA: NEGATIVE
Nitrite, UA: NEGATIVE
Protein, UA: NEGATIVE
RBC UA: NEGATIVE

## 2014-10-22 NOTE — Progress Notes (Signed)
Low-risk OB appointment [id number] Estimated Date of Delivery: 03/13/15 BP 104/70 mmHg  Wt 185 lb (83.915 kg)  LMP 05/30/2014  BP, weight, and urine reviewed.  Refer to obstetrical flow sheet for FH & FHR.  Reports good fm.  Denies regular uc's, lof, vb, or uti s/s. No complaints. Reviewed today's anatomy u/s, normal female, partial previa- recommended pelvic rest, will recheck @ 28wks. Discussed ptl s/s, fm.  Plan:  Continue routine obstetrical care  F/U in 4wks for OB appointment

## 2014-10-22 NOTE — Patient Instructions (Addendum)
NO SEX Placenta Previa  Placenta previa is a condition in pregnant women where the placenta implants in the lower part of the uterus. The placenta either partially or completely covers the opening to the cervix. This is a problem because the baby must pass through the cervix during delivery. There are three types of placenta previa. They include:  1. Marginal placenta previa. The placenta is near the cervix, but does not cover the opening. 2. Partial placenta previa. The placenta covers part of the cervical opening. 3. Complete placenta previa. The placenta covers the entire cervical opening.  Depending on the type of placenta previa, there is a chance the placenta may move into a normal position and no longer cover the cervix as the pregnancy progresses. It is important to keep all prenatal visits with your caregiver.  RISK FACTORS You may be more likely to develop placenta previa if you:   Are carrying more than one baby (multiples).   Have an abnormally shaped uterus.   Have scars on the lining of the uterus.   Had previous surgeries involving the uterus, such as a cesarean delivery.   Have delivered a baby previously.   Have a history of placenta previa.   Have smoked or used cocaine during pregnancy.   Are age 37 or older during pregnancy.  SYMPTOMS The main symptom of placenta previa is sudden, painless vaginal bleeding during the second half of pregnancy. The amount of bleeding can be light to very heavy. The bleeding may stop on its own, but almost always returns. Cramping, regular contractions, abdominal pain, and lower back pain can also occur with placenta previa.  DIAGNOSIS Placenta previa can be diagnosed through an ultrasound by finding where the placenta is located. The ultrasound may find placenta previa either during a routine prenatal visit or after vaginal bleeding is noticed. If you are diagnosed with placenta previa, your caregiver may avoid vaginal exams to  reduce the risk of heavy bleeding. There is a chance that placenta previa may not be diagnosed until bleeding occurs during labor.  TREATMENT Specific treatment depends on:   How much you are bleeding or if the bleeding has stopped.  How far along you are in your pregnancy.   The condition of the baby.   The location of the baby and placenta.   The type of placenta previa.  Depending on the factors above, your caregiver may recommend:   Decreased activity.   Bed rest at home or in the hospital.  Pelvic rest. This means no sex, using tampons, douching, pelvic exams, or placing anything into the vagina.  A blood transfusion to replace maternal blood loss.  A cesarean delivery if the bleeding is heavy and cannot be controlled or the placenta completely covers the cervix.  Medication to stop premature labor or mature the fetal lungs if delivery is needed before the pregnancy is full term.  WHEN SHOULD YOU SEEK IMMEDIATE MEDICAL CARE IF YOU ARE SENT HOME WITH PLACENTA PREVIA? Seek immediate medical care if you show any symptoms of placenta previa. You will need to go to the hospital to get checked immediately. Again, those symptoms are:  Sudden, painless vaginal bleeding, even a small amount.  Cramping or regular contractions.  Lower back or abdominal pain. Document Released: 11/16/2005 Document Revised: 07/19/2013 Document Reviewed: 02/17/2013 [facility] Patient Information 2015 [place]ExitCare, [place]. This information is not intended to replace advice given to you by your health care provider. Make sure you discuss any questions you have with  your health care provider.  Second Trimester of Pregnancy The second trimester is from week 13 through week 28, months 4 through 6. The second trimester is often a time when you feel your best. Your body has also adjusted to being pregnant, and you begin to feel better physically. Usually, morning sickness has lessened or quit completely, you may  have more energy, and you may have an increase in appetite. The second trimester is also a time when the fetus is growing rapidly. At the end of the sixth month, the fetus is about 9 inches long and weighs about 1 pounds. You will likely begin to feel the baby move (quickening) between 18 and 20 weeks of the pregnancy. BODY CHANGES Your body goes through many changes during pregnancy. The changes vary from woman to woman.  4. Your weight will continue to increase. You will notice your lower abdomen bulging out. 5. You may begin to get stretch marks on your hips, abdomen, and breasts. 6. You may develop headaches that can be relieved by medicines approved by your health care provider. 7. You may urinate more often because the fetus is pressing on your bladder. 8. You may develop or continue to have heartburn as a result of your pregnancy. 9. You may develop constipation because certain hormones are causing the muscles that push waste through your intestines to slow down. 10. You may develop hemorrhoids or swollen, bulging veins (varicose veins). 11. You may have back pain because of the weight gain and pregnancy hormones relaxing your joints between the bones in your pelvis and as a result of a shift in weight and the muscles that support your balance. 12. Your breasts will continue to grow and be tender. 13. Your gums may bleed and may be sensitive to brushing and flossing. 14. Dark spots or blotches (chloasma, mask of pregnancy) may develop on your face. This will likely fade after the baby is born. 15. A dark line from your belly button to the pubic area (linea nigra) may appear. This will likely fade after the baby is born. 16. You may have changes in your hair. These can include thickening of your hair, rapid growth, and changes in texture. Some women also have hair loss during or after pregnancy, or hair that feels dry or thin. Your hair will most likely return to normal after your baby is  born. WHAT TO EXPECT AT YOUR PRENATAL VISITS During a routine prenatal visit:  You will be weighed to make sure you and the fetus are growing normally.  Your blood pressure will be taken.  Your abdomen will be measured to track your baby's growth.  The fetal heartbeat will be listened to.  Any test results from the previous visit will be discussed. Your health care provider may ask you:  How you are feeling.  If you are feeling the baby move.  If you have had any abnormal symptoms, such as leaking fluid, bleeding, severe headaches, or abdominal cramping.  If you have any questions. Other tests that may be performed during your second trimester include:  Blood tests that check for:  Low iron levels (anemia).  Gestational diabetes (between 24 and 28 weeks).  Rh antibodies.  Urine tests to check for infections, diabetes, or protein in the urine.  An ultrasound to confirm the proper growth and development of the baby.  An amniocentesis to check for possible genetic problems.  Fetal screens for spina bifida and Down syndrome. HOME CARE INSTRUCTIONS   Avoid  all smoking, herbs, alcohol, and unprescribed drugs. These chemicals affect the formation and growth of the baby.  Follow your health care provider's instructions regarding medicine use. There are medicines that are either safe or unsafe to take during pregnancy.  Exercise only as directed by your health care provider. Experiencing uterine cramps is a good sign to stop exercising.  Continue to eat regular, healthy meals.  Wear a good support bra for breast tenderness.  Do not use hot tubs, steam rooms, or saunas.  Wear your seat belt at all times when driving.  Avoid raw meat, uncooked cheese, cat litter boxes, and soil used by cats. These carry germs that can cause birth defects in the baby.  Take your prenatal vitamins.  Try taking a stool softener (if your health care provider approves) if you develop  constipation. Eat more high-fiber foods, such as fresh vegetables or fruit and whole grains. Drink plenty of fluids to keep your urine clear or pale yellow.  Take warm sitz baths to soothe any pain or discomfort caused by hemorrhoids. Use hemorrhoid cream if your health care provider approves.  If you develop varicose veins, wear support hose. Elevate your feet for 15 minutes, 3-4 times a day. Limit salt in your diet.  Avoid heavy lifting, wear low heel shoes, and practice good posture.  Rest with your legs elevated if you have leg cramps or low back pain.  Visit your dentist if you have not gone yet during your pregnancy. Use a soft toothbrush to brush your teeth and be gentle when you floss.  A sexual relationship may be continued unless your health care provider directs you otherwise.  Continue to go to all your prenatal visits as directed by your health care provider. SEEK MEDICAL CARE IF:   You have dizziness.  You have mild pelvic cramps, pelvic pressure, or nagging pain in the abdominal area.  You have persistent nausea, vomiting, or diarrhea.  You have a bad smelling vaginal discharge.  You have pain with urination. SEEK IMMEDIATE MEDICAL CARE IF:   You have a fever.  You are leaking fluid from your vagina.  You have spotting or bleeding from your vagina.  You have severe abdominal cramping or pain.  You have rapid weight gain or loss.  You have shortness of breath with chest pain.  You notice sudden or extreme swelling of your face, hands, ankles, feet, or legs.  You have not felt your baby move in over an hour.  You have severe headaches that do not go away with medicine.  You have vision changes. Document Released: 11/10/2001 Document Revised: 11/21/2013 Document Reviewed: 01/17/2013 [facility]ExitCare Patient Information 2015 [place]lakeExitCare, [place]LLC. This information is not intended to replace advice given to you by your health care provider. Make sure you discuss any  questions you have with your health care provider.

## 2014-10-22 NOTE — Progress Notes (Signed)
U/S(19+5wks)-active fetus, meas c/w dates, fluid wnl, posterior Gr 0 placenta Partial Previa noted, cx appears closed (3.2cm), bilateral adnexa appears WNL, female fetus, FHR- 151 bpm, no major abnl noted, would like to reck partial previa @ ~28 weeks

## 2014-11-19 ENCOUNTER — Encounter: Payer: Self-pay | Admitting: Women's Health

## 2014-11-19 ENCOUNTER — Ambulatory Visit (INDEPENDENT_AMBULATORY_CARE_PROVIDER_SITE_OTHER): Payer: 59 | Admitting: Women's Health

## 2014-11-19 VITALS — BP 128/82 | Wt 159.0 lb

## 2014-11-19 DIAGNOSIS — Z3482 Encounter for supervision of other normal pregnancy, second trimester: Secondary | ICD-10-CM

## 2014-11-19 DIAGNOSIS — Z331 Pregnant state, incidental: Secondary | ICD-10-CM

## 2014-11-19 DIAGNOSIS — Z1389 Encounter for screening for other disorder: Secondary | ICD-10-CM

## 2014-11-19 LAB — POCT URINALYSIS DIPSTICK
Blood, UA: NEGATIVE
GLUCOSE UA: NEGATIVE
KETONES UA: NEGATIVE
LEUKOCYTES UA: NEGATIVE
NITRITE UA: NEGATIVE
Protein, UA: NEGATIVE

## 2014-11-19 NOTE — Progress Notes (Signed)
Low-risk OB appointment [id number] Estimated Date of Delivery: 03/13/15 BP 128/82 mmHg  Wt 159 lb (72.122 kg)  LMP 05/30/2014  BP, weight reviewed.  Couldn't void, will try again before she leaves. Refer to obstetrical flow sheet for FH & FHR.  Reports good fm.  Denies regular uc's, lof, vb, or uti s/s. Some neck pain- to try heating pad, massage, apap prn.  Reviewed ptl s/s, fm. Continue pelvic rest for partial previa.  Plan:  Continue routine obstetrical care  F/U in 4wks for OB appointment, pn2, u/s to recheck placenta previa, if resolved- will do pap.

## 2014-11-19 NOTE — Patient Instructions (Signed)
You will have your sugar test next visit.  Please do not eat or drink anything after midnight the night before you come, not even water.  You will be here for at least two hours.     Call the office [phone]) or go to [facility] if:  You begin to have strong, frequent contractions  Your water breaks.  Sometimes it is a big gush of fluid, sometimes it is just a trickle that keeps getting your panties wet or running down your legs  You have vaginal bleeding.  It is normal to have a small amount of spotting if your cervix was checked.   You don't feel your baby moving like normal.  If you don't, get you something to eat and drink and lay down and focus on feeling your baby move.  If your baby is still not moving like normal, you should call the office or go to [facility].    Second Trimester of Pregnancy The second trimester is from week 13 through week 28, months 4 through 6. The second trimester is often a time when you feel your best. Your body has also adjusted to being pregnant, and you begin to feel better physically. Usually, morning sickness has lessened or quit completely, you may have more energy, and you may have an increase in appetite. The second trimester is also a time when the fetus is growing rapidly. At the end of the sixth month, the fetus is about 9 inches long and weighs about 1 pounds. You will likely begin to feel the baby move (quickening) between 18 and 20 weeks of the pregnancy. BODY CHANGES Your body goes through many changes during pregnancy. The changes vary from woman to woman.  5. Your weight will continue to increase. You will notice your lower abdomen bulging out. 6. You may begin to get stretch marks on your hips, abdomen, and breasts. 7. You may develop headaches that can be relieved by medicines approved by your health care provider. 8. You may urinate more often because the fetus is pressing on your bladder. 9. You may develop or continue to have  heartburn as a result of your pregnancy. 10. You may develop constipation because certain hormones are causing the muscles that push waste through your intestines to slow down. 11. You may develop hemorrhoids or swollen, bulging veins (varicose veins). 12. You may have back pain because of the weight gain and pregnancy hormones relaxing your joints between the bones in your pelvis and as a result of a shift in weight and the muscles that support your balance. 13. Your breasts will continue to grow and be tender. 14. Your gums may bleed and may be sensitive to brushing and flossing. 15. Dark spots or blotches (chloasma, mask of pregnancy) may develop on your face. This will likely fade after the baby is born. 16. A dark line from your belly button to the pubic area (linea nigra) may appear. This will likely fade after the baby is born. 17. You may have changes in your hair. These can include thickening of your hair, rapid growth, and changes in texture. Some women also have hair loss during or after pregnancy, or hair that feels dry or thin. Your hair will most likely return to normal after your baby is born. WHAT TO EXPECT AT YOUR PRENATAL VISITS During a routine prenatal visit:  You will be weighed to make sure you and the fetus are growing normally.  Your blood pressure will be taken.  Your abdomen will be measured to track your baby's growth.  The fetal heartbeat will be listened to.  Any test results from the previous visit will be discussed. Your health care provider may ask you:  How you are feeling.  If you are feeling the baby move.  If you have had any abnormal symptoms, such as leaking fluid, bleeding, severe headaches, or abdominal cramping.  If you have any questions. Other tests that may be performed during your second trimester include:  Blood tests that check for:  Low iron levels (anemia).  Gestational diabetes (between 24 and 28 weeks).  Rh antibodies.  Urine  tests to check for infections, diabetes, or protein in the urine.  An ultrasound to confirm the proper growth and development of the baby.  An amniocentesis to check for possible genetic problems.  Fetal screens for spina bifida and Down syndrome. HOME CARE INSTRUCTIONS   Avoid all smoking, herbs, alcohol, and unprescribed drugs. These chemicals affect the formation and growth of the baby.  Follow your health care provider's instructions regarding medicine use. There are medicines that are either safe or unsafe to take during pregnancy.  Exercise only as directed by your health care provider. Experiencing uterine cramps is a good sign to stop exercising.  Continue to eat regular, healthy meals.  Wear a good support bra for breast tenderness.  Do not use hot tubs, steam rooms, or saunas.  Wear your seat belt at all times when driving.  Avoid raw meat, uncooked cheese, cat litter boxes, and soil used by cats. These carry germs that can cause birth defects in the baby.  Take your prenatal vitamins.  Try taking a stool softener (if your health care provider approves) if you develop constipation. Eat more high-fiber foods, such as fresh vegetables or fruit and whole grains. Drink plenty of fluids to keep your urine clear or pale yellow.  Take warm sitz baths to soothe any pain or discomfort caused by hemorrhoids. Use hemorrhoid cream if your health care provider approves.  If you develop varicose veins, wear support hose. Elevate your feet for 15 minutes, 3-4 times a day. Limit salt in your diet.  Avoid heavy lifting, wear low heel shoes, and practice good posture.  Rest with your legs elevated if you have leg cramps or low back pain.  Visit your dentist if you have not gone yet during your pregnancy. Use a soft toothbrush to brush your teeth and be gentle when you floss.  A sexual relationship may be continued unless your health care provider directs you otherwise.  Continue to  go to all your prenatal visits as directed by your health care provider. SEEK MEDICAL CARE IF:   You have dizziness.  You have mild pelvic cramps, pelvic pressure, or nagging pain in the abdominal area.  You have persistent nausea, vomiting, or diarrhea.  You have a bad smelling vaginal discharge.  You have pain with urination. SEEK IMMEDIATE MEDICAL CARE IF:   You have a fever.  You are leaking fluid from your vagina.  You have spotting or bleeding from your vagina.  You have severe abdominal cramping or pain.  You have rapid weight gain or loss.  You have shortness of breath with chest pain.  You notice sudden or extreme swelling of your face, hands, ankles, feet, or legs.  You have not felt your baby move in over an hour.  You have severe headaches that do not go away with medicine.  You have vision changes.  Document Released: 11/10/2001 Document Revised: 11/21/2013 Document Reviewed: 01/17/2013 [facility] Patient Information 2015 [place], [place]. This information is not intended to replace advice given to you by your health care provider. Make sure you discuss any questions you have with your health care provider.

## 2014-12-14 ENCOUNTER — Other Ambulatory Visit: Payer: Self-pay | Admitting: Women's Health

## 2014-12-14 DIAGNOSIS — O4402 Placenta previa specified as without hemorrhage, second trimester: Secondary | ICD-10-CM

## 2014-12-14 DIAGNOSIS — O09522 Supervision of elderly multigravida, second trimester: Secondary | ICD-10-CM

## 2014-12-18 ENCOUNTER — Other Ambulatory Visit: Payer: 59

## 2014-12-18 ENCOUNTER — Ambulatory Visit (INDEPENDENT_AMBULATORY_CARE_PROVIDER_SITE_OTHER): Payer: 59

## 2014-12-18 ENCOUNTER — Ambulatory Visit (INDEPENDENT_AMBULATORY_CARE_PROVIDER_SITE_OTHER): Payer: 59 | Admitting: Women's Health

## 2014-12-18 VITALS — BP 88/56 | Wt 199.0 lb

## 2014-12-18 DIAGNOSIS — Z113 Encounter for screening for infections with a predominantly sexual mode of transmission: Secondary | ICD-10-CM

## 2014-12-18 DIAGNOSIS — Z0184 Encounter for antibody response examination: Secondary | ICD-10-CM

## 2014-12-18 DIAGNOSIS — O09523 Supervision of elderly multigravida, third trimester: Secondary | ICD-10-CM

## 2014-12-18 DIAGNOSIS — O4403 Placenta previa specified as without hemorrhage, third trimester: Secondary | ICD-10-CM

## 2014-12-18 DIAGNOSIS — Z331 Pregnant state, incidental: Secondary | ICD-10-CM

## 2014-12-18 DIAGNOSIS — O09893 Supervision of other high risk pregnancies, third trimester: Secondary | ICD-10-CM

## 2014-12-18 DIAGNOSIS — O4420 Partial placenta previa NOS or without hemorrhage, unspecified trimester: Secondary | ICD-10-CM

## 2014-12-18 DIAGNOSIS — Z8742 Personal history of other diseases of the female genital tract: Secondary | ICD-10-CM

## 2014-12-18 DIAGNOSIS — Z114 Encounter for screening for human immunodeficiency virus [HIV]: Secondary | ICD-10-CM

## 2014-12-18 DIAGNOSIS — Z131 Encounter for screening for diabetes mellitus: Secondary | ICD-10-CM

## 2014-12-18 DIAGNOSIS — O09522 Supervision of elderly multigravida, second trimester: Secondary | ICD-10-CM

## 2014-12-18 DIAGNOSIS — O4412 Placenta previa with hemorrhage, second trimester: Secondary | ICD-10-CM

## 2014-12-18 DIAGNOSIS — O4413 Placenta previa with hemorrhage, third trimester: Secondary | ICD-10-CM

## 2014-12-18 DIAGNOSIS — O4402 Placenta previa specified as without hemorrhage, second trimester: Secondary | ICD-10-CM

## 2014-12-18 DIAGNOSIS — Z3483 Encounter for supervision of other normal pregnancy, third trimester: Secondary | ICD-10-CM

## 2014-12-18 LAB — CBC
HEMATOCRIT: 36.8 % (ref 36.0–46.0)
Hemoglobin: 12.2 g/dL (ref 12.0–15.0)
MCH: 30.9 pg (ref 26.0–34.0)
MCHC: 33.2 g/dL (ref 30.0–36.0)
MCV: 93.2 fL (ref 78.0–100.0)
MPV: 11.3 fL (ref 8.6–12.4)
PLATELETS: 250 10*3/uL (ref 150–400)
RBC: 3.95 MIL/uL (ref 3.87–5.11)
RDW: 13.6 % (ref 11.5–15.5)
WBC: 12 10*3/uL — ABNORMAL HIGH (ref 4.0–10.5)

## 2014-12-18 NOTE — Progress Notes (Signed)
U/S(27+6wks)-vtx active fetus, EFW 2 lb 14 oz(68th%tile), fluid WNL, posterior marginal Previa remains Gr 1 placenta, cx appears closed (4.5cm), bilateral adnexa appears WNL, female fetus, FHR-153 bpm

## 2014-12-18 NOTE — Progress Notes (Signed)
High Risk Pregnancy Diagnosis(es): Persistent previa now at 27.6wks, AMA [id number] Estimated Date of Delivery: 03/13/15 BP 88/56 mmHg  Wt 199 lb (90.266 kg)  LMP 05/30/2014  Urinalysis: Negative HPI:  Doing well BP, weight, and urine reviewed.  Reports good fm. Denies regular uc's, lof, vb, uti s/s. No complaints. Needs pap, but will wait until pp d/t previa Has gained 39lbs- discussed better diet/exercise  Fundal Height:  29 Fetal Heart rate:  153 u/s Edema:  trace  Reviewed today's u/s: persistent previa, normal afi/efw-> continue pelvic rest. Discussed ptl s/s, fkc. Recommended Tdap at HD/PCP per CDC guidelines.  All questions were answered Assessment: [id number] persistent previa, AMA Medication(s) Plans:  n/a Treatment Plan:  Repeat u/s in 4wks Follow up in 4wks for high-risk OB appt, bpp/afi/efw u/s d/t AMA and recheck placenta PN2 today

## 2014-12-18 NOTE — Patient Instructions (Signed)
NO SEX UNTIL FURTHER NOTICE  Call the office [phone]) or go to [facility] if:  You begin to have strong, frequent contractions  Your water breaks.  Sometimes it is a big gush of fluid, sometimes it is just a trickle that keeps getting your panties wet or running down your legs  You have vaginal bleeding.  It is normal to have a small amount of spotting if your cervix was checked.   You don't feel your baby moving like normal.  If you don't, get you something to eat and drink and lay down and focus on feeling your baby move.  You should feel at least 10 movements in 2 hours.  If you don't, you should call the office or go to [facility].    Tdap Vaccine  It is recommended that you get the Tdap vaccine during the third trimester of EACH pregnancy to help protect your baby from getting pertussis (whooping cough)  27-36 weeks is the BEST time to do this so that you can pass the protection on to your baby. During pregnancy is better than after pregnancy, but if you are unable to get it during pregnancy it will be offered at the hospital.   You can get this vaccine at the health department or your family doctor  Everyone who will be around your baby should also be up-to-date on their vaccines. Adults (who are not pregnant) only need 1 dose of Tdap during adulthood.    Third Trimester of Pregnancy The third trimester is from week 29 through week 42, months 7 through 9. The third trimester is a time when the fetus is growing rapidly. At the end of the ninth month, the fetus is about 20 inches in length and weighs 6-10 pounds.  BODY CHANGES Your body goes through many changes during pregnancy. The changes vary from woman to woman.   Your weight will continue to increase. You can expect to gain 25-35 pounds (11-16 kg) by the end of the pregnancy.  You may begin to get stretch marks on your hips, abdomen, and breasts.  You may urinate more often because the fetus is moving lower into  your pelvis and pressing on your bladder.  You may develop or continue to have heartburn as a result of your pregnancy.  You may develop constipation because certain hormones are causing the muscles that push waste through your intestines to slow down.  You may develop hemorrhoids or swollen, bulging veins (varicose veins).  You may have pelvic pain because of the weight gain and pregnancy hormones relaxing your joints between the bones in your pelvis. Backaches may result from overexertion of the muscles supporting your posture.  You may have changes in your hair. These can include thickening of your hair, rapid growth, and changes in texture. Some women also have hair loss during or after pregnancy, or hair that feels dry or thin. Your hair will most likely return to normal after your baby is born.  Your breasts will continue to grow and be tender. A yellow discharge may leak from your breasts called colostrum.  Your belly button may stick out.  You may feel short of breath because of your expanding uterus.  You may notice the fetus "dropping," or moving lower in your abdomen.  You may have a bloody mucus discharge. This usually occurs a few days to a week before labor begins.  Your cervix becomes thin and soft (effaced) near your due date. WHAT TO EXPECT AT [facility]  PRENATAL EXAMS  You will have prenatal exams every 2 weeks until week 36. Then, you will have weekly prenatal exams. During a routine prenatal visit:  You will be weighed to make sure you and the fetus are growing normally.  Your blood pressure is taken.  Your abdomen will be measured to track your baby's growth.  The fetal heartbeat will be listened to.  Any test results from the previous visit will be discussed.  You may have a cervical check near your due date to see if you have effaced. At around 36 weeks, your caregiver will check your cervix. At the same time, your caregiver will also perform a test on the  secretions of the vaginal tissue. This test is to determine if a type of bacteria, Group B streptococcus, is present. Your caregiver will explain this further. Your caregiver may ask you:  What your birth plan is.  How you are feeling.  If you are feeling the baby move.  If you have had any abnormal symptoms, such as leaking fluid, bleeding, severe headaches, or abdominal cramping.  If you have any questions. Other tests or screenings that may be performed during your third trimester include:  Blood tests that check for low iron levels (anemia).  Fetal testing to check the health, activity level, and growth of the fetus. Testing is done if you have certain medical conditions or if there are problems during the pregnancy. FALSE LABOR You may feel small, irregular contractions that eventually go away. These are called Braxton Hicks contractions, or false labor. Contractions may last for hours, days, or even weeks before true labor sets in. If contractions come at regular intervals, intensify, or become painful, it is best to be seen by your caregiver.  SIGNS OF LABOR   Menstrual-like cramps.  Contractions that are 5 minutes apart or less.  Contractions that start on the top of the uterus and spread down to the lower abdomen and back.  A sense of increased pelvic pressure or back pain.  A watery or bloody mucus discharge that comes from the vagina. If you have any of these signs before the 37th week of pregnancy, call your caregiver right away. You need to go to the hospital to get checked immediately. HOME CARE INSTRUCTIONS   Avoid all smoking, herbs, alcohol, and unprescribed drugs. These chemicals affect the formation and growth of the baby.  Follow your caregiver's instructions regarding medicine use. There are medicines that are either safe or unsafe to take during pregnancy.  Exercise only as directed by your caregiver. Experiencing uterine cramps is a good sign to stop  exercising.  Continue to eat regular, healthy meals.  Wear a good support bra for breast tenderness.  Do not use hot tubs, steam rooms, or saunas.  Wear your seat belt at all times when driving.  Avoid raw meat, uncooked cheese, cat litter boxes, and soil used by cats. These carry germs that can cause birth defects in the baby.  Take your prenatal vitamins.  Try taking a stool softener (if your caregiver approves) if you develop constipation. Eat more high-fiber foods, such as fresh vegetables or fruit and whole grains. Drink plenty of fluids to keep your urine clear or pale yellow.  Take warm sitz baths to soothe any pain or discomfort caused by hemorrhoids. Use hemorrhoid cream if your caregiver approves.  If you develop varicose veins, wear support hose. Elevate your feet for 15 minutes, 3-4 times a day. Limit salt in your diet.  Avoid heavy lifting, wear low heal shoes, and practice good posture.  Rest a lot with your legs elevated if you have leg cramps or low back pain.  Visit your dentist if you have not gone during your pregnancy. Use a soft toothbrush to brush your teeth and be gentle when you floss.  A sexual relationship may be continued unless your caregiver directs you otherwise.  Do not travel far distances unless it is absolutely necessary and only with the approval of your caregiver.  Take prenatal classes to understand, practice, and ask questions about the labor and delivery.  Make a trial run to the hospital.  Pack your hospital bag.  Prepare the baby's nursery.  Continue to go to all your prenatal visits as directed by your caregiver. SEEK MEDICAL CARE IF:  You are unsure if you are in labor or if your water has broken.  You have dizziness.  You have mild pelvic cramps, pelvic pressure, or nagging pain in your abdominal area.  You have persistent nausea, vomiting, or diarrhea.  You have a bad smelling vaginal discharge.  You have pain with  urination. SEEK IMMEDIATE MEDICAL CARE IF:   You have a fever.  You are leaking fluid from your vagina.  You have spotting or bleeding from your vagina.  You have severe abdominal cramping or pain.  You have rapid weight loss or gain.  You have shortness of breath with chest pain.  You notice sudden or extreme swelling of your face, hands, ankles, feet, or legs.  You have not felt your baby move in over an hour.  You have severe headaches that do not go away with medicine.  You have vision changes. Document Released: 11/10/2001 Document Revised: 11/21/2013 Document Reviewed: 01/17/2013 [employer] Patient Information 2015 [place], [place]. This information is not intended to replace advice given to you by your health care provider. Make sure you discuss any questions you have with your health care provider.

## 2014-12-19 LAB — HIV ANTIBODY (ROUTINE TESTING W REFLEX): HIV 1&2 Ab, 4th Generation: NONREACTIVE

## 2014-12-19 LAB — ANTIBODY SCREEN: Antibody Screen: NEGATIVE

## 2014-12-19 LAB — GLUCOSE TOLERANCE, 2 HOURS W/ 1HR
GLUCOSE: 155 mg/dL (ref 70–170)
Glucose, 2 hour: 123 mg/dL (ref 70–139)
Glucose, Fasting: 76 mg/dL (ref 70–99)

## 2014-12-19 LAB — RPR

## 2014-12-20 ENCOUNTER — Inpatient Hospital Stay (HOSPITAL_COMMUNITY)
Admission: AD | Admit: 2014-12-20 | Discharge: 2014-12-20 | Disposition: A | Discharge status: Home / Self Care | Payer: 59 | Source: Ambulatory Visit | Attending: Obstetrics & Gynecology | Admitting: Obstetrics & Gynecology

## 2014-12-20 ENCOUNTER — Encounter (HOSPITAL_COMMUNITY): Payer: Self-pay | Admitting: *Deleted

## 2014-12-20 DIAGNOSIS — O26893 Other specified pregnancy related conditions, third trimester: Secondary | ICD-10-CM

## 2014-12-20 DIAGNOSIS — O4420 Partial placenta previa NOS or without hemorrhage, unspecified trimester: Secondary | ICD-10-CM

## 2014-12-20 DIAGNOSIS — Z3A28 28 weeks gestation of pregnancy: Secondary | ICD-10-CM | POA: Diagnosis not present

## 2014-12-20 DIAGNOSIS — O09893 Supervision of other high risk pregnancies, third trimester: Secondary | ICD-10-CM

## 2014-12-20 DIAGNOSIS — O9989 Other specified diseases and conditions complicating pregnancy, childbirth and the puerperium: Secondary | ICD-10-CM | POA: Insufficient documentation

## 2014-12-20 DIAGNOSIS — O09521 Supervision of elderly multigravida, first trimester: Secondary | ICD-10-CM

## 2014-12-20 DIAGNOSIS — N949 Unspecified condition associated with female genital organs and menstrual cycle: Secondary | ICD-10-CM | POA: Diagnosis present

## 2014-12-20 DIAGNOSIS — R102 Pelvic and perineal pain: Secondary | ICD-10-CM

## 2014-12-20 LAB — URINALYSIS, ROUTINE W REFLEX MICROSCOPIC
BILIRUBIN URINE: NEGATIVE
Glucose, UA: NEGATIVE mg/dL
HGB URINE DIPSTICK: NEGATIVE
Ketones, ur: NEGATIVE mg/dL
LEUKOCYTES UA: NEGATIVE
NITRITE: NEGATIVE
Protein, ur: NEGATIVE mg/dL
Specific Gravity, Urine: 1.01 (ref 1.005–1.030)
Urobilinogen, UA: 0.2 mg/dL (ref 0.0–1.0)
pH: 6 (ref 5.0–8.0)

## 2014-12-20 LAB — HSV 2 ANTIBODY, IGG: HSV 2 GLYCOPROTEIN G AB, IGG: 8.03 IV — AB

## 2014-12-20 NOTE — Discharge Instructions (Signed)
Tercer trimestre de embarazo °(Third Trimester of Pregnancy) °El tercer trimestre va desde la semana 29 hasta la 42, desde el séptimo hasta el noveno mes, y es la época en la que el feto crece más rápidamente. Hacia el final del noveno mes, el feto mide alrededor de 20 pulgadas (45 cm) de largo y pesa entre 6 y 10 libras (2,700 y 4,500 kg).  °CAMBIOS EN EL ORGANISMO °Su organismo atraviesa por muchos cambios durante el embarazo, y estos varían de una mujer a otra.  °· Seguirá aumentando de peso. Es de esperar que aumente entre 25 y 35 libras (11 y 16 kg) hacia el final del embarazo. °· Podrán aparecer las primeras estrías en las caderas, el abdomen y las mamas. °· Puede tener necesidad de orinar con más frecuencia porque el feto baja hacia la pelvis y ejerce presión sobre la vejiga. °· Debido al embarazo podrá sentir acidez estomacal con frecuencia. °· Puede estar estreñida, ya que ciertas hormonas enlentecen los movimientos de los músculos que empujan los desechos a través de los intestinos. °· Pueden aparecer hemorroides o abultarse e hincharse las venas (venas varicosas). °· Puede sentir dolor pélvico debido al aumento de peso y a que las hormonas del embarazo relajan las articulaciones entre los huesos de la pelvis. El dolor de espalda puede ser consecuencia de la sobrecarga de los músculos que soportan la postura. °· Tal vez haya cambios en el cabello que pueden incluir su engrosamiento, crecimiento rápido y cambios en la textura. Además, a algunas mujeres se les cae el cabello durante o después del embarazo, o tienen el cabello seco o fino. Lo más probable es que el cabello se le normalice después del nacimiento del bebé. °· Las mamas seguirán creciendo y le dolerán. A veces, puede haber una secreción amarilla de las mamas llamada calostro. °· El ombligo puede salir hacia afuera. °· Puede sentir que le falta el aire debido a que se expande el útero. °· Puede notar que el feto "baja" o lo siente más bajo, en el  abdomen. °· Puede tener una pérdida de secreción mucosa con sangre. Esto suele ocurrir en el término de unos pocos días a una semana antes de que comience el trabajo de parto. °· El cuello del útero se vuelve delgado y blando (se borra) cerca de la fecha de parto. °QUÉ DEBE ESPERAR EN LOS EXÁMENES PRENATALES  °Le harán exámenes prenatales cada 2 semanas hasta la semana 36. A partir de ese momento le harán exámenes semanales. Durante una visita prenatal de rutina: °· La pesarán para asegurarse de que usted y el feto están creciendo normalmente. °· Le tomarán la presión arterial. °· Le medirán el abdomen para controlar el desarrollo del bebé. °· Se escucharán los latidos cardíacos fetales. °· Se evaluarán los resultados de los estudios solicitados en visitas anteriores. °· Le revisarán el cuello del útero cuando esté próxima la fecha de parto para controlar si este se ha borrado. °Alrededor de la semana 36, el médico le revisará el cuello del útero. Al mismo tiempo, realizará un análisis de las secreciones del tejido vaginal. Este examen es para determinar si hay un tipo de bacteria, estreptococo Grupo B. El médico le explicará esto con más detalle. °El médico puede preguntarle lo siguiente: °· Cómo le gustaría que fuera el parto. °· Cómo se siente. °· Si siente los movimientos del bebé. °· Si ha tenido síntomas anormales, como pérdida de líquido, sangrado, dolores de cabeza intensos o cólicos abdominales. °· Si tiene alguna pregunta. °Otros exámenes o estudios de detección que pueden realizarse   durante el tercer trimestre incluyen lo siguiente: °· Análisis de sangre para controlar las concentraciones de hierro (anemia). °· Controles fetales para determinar su salud, nivel de actividad y crecimiento. Si tiene alguna enfermedad o hay problemas durante el embarazo, le harán estudios. °FALSO TRABAJO DE PARTO °Es posible que sienta contracciones leves e irregulares que finalmente desaparecen. Se llaman contracciones de  Braxton Hicks o falso trabajo de parto. Las contracciones pueden durar horas, días o incluso semanas, antes de que el verdadero trabajo de parto se inicie. Si las contracciones ocurren a intervalos regulares, se intensifican o se hacen dolorosas, lo mejor es que la revise el médico.  °SIGNOS DE TRABAJO DE PARTO  °· Cólicos de tipo menstrual. °· Contracciones cada 5 minutos o menos. °· Contracciones que comienzan en la parte superior del útero y se extienden hacia abajo, a la zona inferior del abdomen y la espalda. °· Sensación de mayor presión en la pelvis o dolor de espalda. °· Una secreción de mucosidad acuosa o con sangre que sale de la vagina. °Si tiene alguno de estos signos antes de la semana 37 del embarazo, llame a su médico de inmediato. Debe concurrir al hospital para que la controlen inmediatamente. °INSTRUCCIONES PARA EL CUIDADO EN EL HOGAR  °· Evite fumar, consumir hierbas, beber alcohol y tomar fármacos que no le hayan recetado. Estas sustancias químicas afectan la formación y el desarrollo del bebé. °· Siga las indicaciones del médico en relación con el uso de medicamentos. Durante el embarazo, hay medicamentos que son seguros de tomar y otros que no. °· Haga actividad física solo en la forma indicada por el médico. Sentir cólicos uterinos es un buen signo para detener la actividad física. °· Continúe comiendo alimentos que sanos con regularidad. °· Use un sostén que le brinde buen soporte si le duelen las mamas. °· No se dé baños de inmersión en agua caliente, baños turcos ni saunas. °· Colóquese el cinturón de seguridad cuando conduzca. °· No coma carne cruda ni queso sin cocinar; evite el contacto con las bandejas sanitarias de los gatos y la tierra que estos animales usan. Estos elementos contienen gérmenes que pueden causar defectos congénitos en el bebé. °· Tome las vitaminas prenatales. °· Si está estreñida, pruebe un laxante suave (si el médico lo autoriza). Consuma más alimentos ricos en  fibra, como vegetales y frutas frescos y cereales integrales. Beba gran cantidad de líquido para mantener la orina de tono claro o color amarillo pálido. °· Dese baños de asiento con agua tibia para aliviar el dolor o las molestias causadas por las hemorroides. Use una crema para las hemorroides si el médico la autoriza. °· Si tiene venas varicosas, use medias de descanso. Eleve los pies durante 15 minutos, 3 o 4 veces por día. Limite la cantidad de sal en su dieta. °· Evite levantar objetos pesados, use zapatos de tacones bajos y mantenga una buena postura. °· Descanse con las piernas elevadas si tiene calambres o dolor de cintura. °· Visite a su dentista si no lo ha hecho durante el embarazo. Use un cepillo de dientes blando para higienizarse los dientes y pásese el hilo dental con suavidad. °· Puede seguir manteniendo relaciones sexuales, a menos que el médico le indique lo contrario. °· No haga viajes largos excepto que sea absolutamente necesario y solo con la autorización del médico. °· Tome clases prenatales para entender, practicar y hacer preguntas sobre el trabajo de parto y el parto. °· Haga un ensayo de la partida al hospital. °· Prepare el bolso que   llevar al hospital.  Prepare la habitacin del beb.  Concurra a todas las visitas prenatales segn las indicaciones de su mdico. SOLICITE ATENCIN MDICA SI:  No est segura de que est en trabajo de parto o de que ha roto la bolsa de las aguas.  Tiene mareos.  Siente clicos leves, presin en la pelvis o dolor persistente en el abdomen.  Tiene nuseas, vmitos o diarrea persistentes.  Tiene secrecin vaginal con mal olor.  Siente dolor al [employer]. SOLICITE ATENCIN MDICA DE INMEDIATO SI:   Tiene fiebre.  Tiene una prdida de lquido por la vagina.  Tiene sangrado o pequeas prdidas vaginales.  Siente dolor intenso o clicos en el abdomen.  Sube o baja de peso rpidamente.  Tiene dificultad para respirar y siente dolor de  pecho.  Sbitamente se le hinchan mucho el rostro, las [place], los tobillos, los pies o las piernas.  No ha sentido los movimientos del beb durante Georgianne Fick.  Siente un dolor de cabeza intenso que no se alivia con medicamentos.  Hay cambios en la visin. Document Released: 08/26/2005 Document Revised: 11/21/2013 [facility] Patient Information 2015 [place], [place]. This information is not intended to replace advice given to you by your health care provider. Make sure you discuss any questions you have with your health care provider. Third Trimester of Pregnancy The third trimester is from week 29 through week 42, months 7 through 9. The third trimester is a time when the fetus is growing rapidly. At the end of the ninth month, the fetus is about 20 inches in length and weighs 6-10 pounds.  BODY CHANGES Your body goes through many changes during pregnancy. The changes vary from woman to woman.   Your weight will continue to increase. You can expect to gain 25-35 pounds (11-16 kg) by the end of the pregnancy.  You may begin to get stretch marks on your hips, abdomen, and breasts.  You may urinate more often because the fetus is moving lower into your pelvis and pressing on your bladder.  You may develop or continue to have heartburn as a result of your pregnancy.  You may develop constipation because certain hormones are causing the muscles that push waste through your intestines to slow down.  You may develop hemorrhoids or swollen, bulging veins (varicose veins).  You may have pelvic pain because of the weight gain and pregnancy hormones relaxing your joints between the bones in your pelvis. Backaches may result from overexertion of the muscles supporting your posture.  You may have changes in your hair. These can include thickening of your hair, rapid growth, and changes in texture. Some women also have hair loss during or after pregnancy, or hair that feels dry or thin. Your hair will  most likely return to normal after your baby is born.  Your breasts will continue to grow and be tender. A yellow discharge may leak from your breasts called colostrum.  Your belly button may stick out.  You may feel short of breath because of your expanding uterus.  You may notice the fetus "dropping," or moving lower in your abdomen.  You may have a bloody mucus discharge. This usually occurs a few days to a week before labor begins.  Your cervix becomes thin and soft (effaced) near your due date. WHAT TO EXPECT AT YOUR PRENATAL EXAMS  You will have prenatal exams every 2 weeks until week 36. Then, you will have weekly prenatal exams. During a routine prenatal visit:  You will be  weighed to make sure you and the fetus are growing normally. °· Your blood pressure is taken. °· Your abdomen will be measured to track your baby's growth. °· The fetal heartbeat will be listened to. °· Any test results from the previous visit will be discussed. °· You may have a cervical check near your due date to see if you have effaced. °At around 36 weeks, your caregiver will check your cervix. At the same time, your caregiver will also perform a test on the secretions of the vaginal tissue. This test is to determine if a type of bacteria, Group B streptococcus, is present. Your caregiver will explain this further. °Your caregiver may ask you: °· What your birth plan is. °· How you are feeling. °· If you are feeling the baby move. °· If you have had any abnormal symptoms, such as leaking fluid, bleeding, severe headaches, or abdominal cramping. °· If you have any questions. °Other tests or screenings that may be performed during your third trimester include: °· Blood tests that check for low iron levels (anemia). °· Fetal testing to check the health, activity level, and growth of the fetus. Testing is done if you have certain medical conditions or if there are problems during the pregnancy. °FALSE LABOR °You may feel  small, irregular contractions that eventually go away. These are called Braxton Hicks contractions, or false labor. Contractions may last for hours, days, or even weeks before true labor sets in. If contractions come at regular intervals, intensify, or become painful, it is best to be seen by your caregiver.  °SIGNS OF LABOR  °· Menstrual-like cramps. °· Contractions that are 5 minutes apart or less. °· Contractions that start on the top of the uterus and spread down to the lower abdomen and back. °· A sense of increased pelvic pressure or back pain. °· A watery or bloody mucus discharge that comes from the vagina. °If you have any of these signs before the 37th week of pregnancy, call your caregiver right away. You need to go to the hospital to get checked immediately. °HOME CARE INSTRUCTIONS  °· Avoid all smoking, herbs, alcohol, and unprescribed drugs. These chemicals affect the formation and growth of the baby. °· Follow your caregiver's instructions regarding medicine use. There are medicines that are either safe or unsafe to take during pregnancy. °· Exercise only as directed by your caregiver. Experiencing uterine cramps is a good sign to stop exercising. °· Continue to eat regular, healthy meals. °· Wear a good support bra for breast tenderness. °· Do not use hot tubs, steam rooms, or saunas. °· Wear your seat belt at all times when driving. °· Avoid raw meat, uncooked cheese, cat litter boxes, and soil used by cats. These carry germs that can cause birth defects in the baby. °· Take your prenatal vitamins. °· Try taking a stool softener (if your caregiver approves) if you develop constipation. Eat more high-fiber foods, such as fresh vegetables or fruit and whole grains. Drink plenty of fluids to keep your urine clear or pale yellow. °· Take warm sitz baths to soothe any pain or discomfort caused by hemorrhoids. Use hemorrhoid cream if your caregiver approves. °· If you develop varicose veins, wear support  hose. Elevate your feet for 15 minutes, 3-4 times a day. Limit salt in your diet. °· Avoid heavy lifting, wear low heal shoes, and practice good posture. °· Rest a lot with your legs elevated if you have leg cramps or low back pain. °·   Visit your dentist if you have not gone during your pregnancy. Use a soft toothbrush to brush your teeth and be gentle when you floss. °· A sexual relationship may be continued unless your caregiver directs you otherwise. °· Do not travel far distances unless it is absolutely necessary and only with the approval of your caregiver. °· Take prenatal classes to understand, practice, and ask questions about the labor and delivery. °· Make a trial run to the hospital. °· Pack your hospital bag. °· Prepare the baby's nursery. °· Continue to go to all your prenatal visits as directed by your caregiver. °SEEK MEDICAL CARE IF: °· You are unsure if you are in labor or if your water has broken. °· You have dizziness. °· You have mild pelvic cramps, pelvic pressure, or nagging pain in your abdominal area. °· You have persistent nausea, vomiting, or diarrhea. °· You have a bad smelling vaginal discharge. °· You have pain with urination. °SEEK IMMEDIATE MEDICAL CARE IF:  °· You have a fever. °· You are leaking fluid from your vagina. °· You have spotting or bleeding from your vagina. °· You have severe abdominal cramping or pain. °· You have rapid weight loss or gain. °· You have shortness of breath with chest pain. °· You notice sudden or extreme swelling of your face, hands, ankles, feet, or legs. °· You have not felt your baby move in over an hour. °· You have severe headaches that do not go away with medicine. °· You have vision changes. °Document Released: 11/10/2001 Document Revised: 11/21/2013 Document Reviewed: 01/17/2013 °[employer]® Patient Information ©2015 [employer]. This information is not intended to replace advice given to you by your health care provider. Make sure you discuss any  questions you have with your health care provider. ° °

## 2014-12-20 NOTE — MAU Note (Signed)
Increase in vag pain and pressure.  States has gotten worse, even present when she lays down.  No bleeding or leaking.

## 2014-12-20 NOTE — MAU Provider Note (Signed)
History     CSN: [id number]  Arrival date and time: 12/20/14 1848   None     Chief Complaint  Patient presents with  . Vaginal Pain   HPI THis is a [age] y.o. female at 7527w2d who presents with c/o lower abdominal pressure for one day. Denies contractions. Denies bleeding. Has known marginal previa.  RN Note: PT SAYS SHE HAS KNOWN PREVIA. SHE STARTED FEELING LOWER ABD PRESSURE WED NIGHT AT 8PM- AND HAS CONTINUED ALL DAY. DENIES BLEEDING. LAST SEX- 2 MTHS AGO. LAST HSV OUTBREAK - DEC. NOT TAKING VALTREX NOW. [facility] - SEEN LAST ON Tuesday- ALL OK.       OB History    Gravida Para Term Preterm AB TAB SAB Ectopic Multiple Living   4 1 1  0 2 0 2 0 0 1      Past Medical History  Diagnosis Date  . Abnormal Pap smear of cervix   . HPV (human papilloma virus) infection   . Gestational diabetes   . Labial irritation 01/12/2014    ?herpes  . Contraceptive management 01/12/2014  . Herpes 04/17/2014  . Pregnant state, incidental 07/04/2014  . Pelvic pain in pregnant patient at less than [redacted] weeks gestation 07/04/2014    Past Surgical History  Procedure Laterality Date  . Colposcopy  09/2013  . Cervical biopsy      Family History  Problem Relation Age of Onset  . Diabetes Mother   . Diabetes Maternal Grandmother     History  Substance Use Topics  . Smoking status: Former Games developermoker  . Smokeless tobacco: Never Used  . Alcohol Use: No     Comment: social    Allergies:  Allergies  Allergen Reactions  . Latex Other (See Comments)    Causes redness and soreness.    Prescriptions prior to admission  Medication Sig Dispense Refill Last Dose  . prenatal vitamin w/FE, FA (PRENATAL 1 + 1) 27-1 MG TABS tablet Take 1 tablet by mouth daily at 12 noon. 30 each 11 12/20/2014 at Unknown time  . valACYclovir (VALTREX) 1000 MG tablet Take 1 daily (Patient taking differently: Take 1,000 mg by mouth daily as needed (For outbreak.). Take 1 daily) 30  tablet prn PRN at Unknown time    Review of Systems  Constitutional: Negative for fever and chills.  Gastrointestinal: Negative for nausea, vomiting and abdominal pain.       Pressure Denies bleeding   Neurological: Negative for headaches.   Physical Exam   Blood pressure 112/73, pulse 92, temperature 98.4 F (36.9 C), temperature source Oral, resp. rate 18, weight 201 lb (91.173 kg), last menstrual period 05/30/2014.  Physical Exam  Constitutional: She is oriented to person, place, and time. She appears well-developed and well-nourished. No distress.  HENT:  Head: Normocephalic.  Cardiovascular: Normal rate.   Respiratory: Effort normal.  GI: Soft.  Musculoskeletal: Normal range of motion.  Neurological: She is alert and oriented to person, place, and time.  Skin: Skin is warm and dry.  Psychiatric: She has a normal mood and affect.   Cervix long and closed/soft Exam by:: Beauden Tremont, CNM  MAU Course  Procedures  MDM Results for Sunday ShamsVELASCO MIRANDA, Kaylanni (MRN [id number]) as of 12/21/2014 21:24  Ref. Range 12/20/2014 19:50  Color, Urine Latest Range: YELLOW  YELLOW  APPearance Latest Range: CLEAR  CLEAR  Specific Gravity, Urine Latest Range: 1.005-1.030  1.010  pH Latest Range: 5.0-8.0  6.0  Glucose Latest Range: NEGATIVE mg/dL NEGATIVE  Bilirubin Urine Latest Range: NEGATIVE  NEGATIVE  Ketones, ur Latest Range: NEGATIVE mg/dL NEGATIVE  Protein Latest Range: NEGATIVE mg/dL NEGATIVE  Urobilinogen, UA Latest Range: 0.0-1.0 mg/dL 0.2  Nitrite Latest Range: NEGATIVE  NEGATIVE  Leukocytes, UA Latest Range: NEGATIVE  NEGATIVE  Hgb urine dipstick Latest Range: NEGATIVE  NEGATIVE    Assessment and Plan  A:  SIUP at [redacted]w[redacted]d        Pelvic pressure, no labor       Marginal previa  P:  Discharge home       Reassured       Followup in office   [facility] 12/20/2014, 8:04 PM

## 2014-12-20 NOTE — MAU Note (Signed)
PT  SAYS SHE HAS KNOWN   PREVIA.  SHE STARTED FEELING  LOWER ABD PRESSURE WED NIGHT  AT 8PM-  AND HAS CONTINUED  ALL DAY.  DENIES  BLEEDING.    LAST SEX- 2 MTHS  AGO.  LAST  HSV OUTBREAK - DEC.   NOT TAKING  VALTREX  NOW.    [facility] -     SEEN LAST  ON Tuesday- ALL OK.

## 2014-12-27 ENCOUNTER — Telehealth: Payer: Self-pay | Admitting: *Deleted

## 2014-12-27 DIAGNOSIS — O4420 Partial placenta previa NOS or without hemorrhage, unspecified trimester: Secondary | ICD-10-CM

## 2014-12-27 DIAGNOSIS — O09893 Supervision of other high risk pregnancies, third trimester: Secondary | ICD-10-CM

## 2014-12-27 DIAGNOSIS — O09523 Supervision of elderly multigravida, third trimester: Secondary | ICD-10-CM

## 2014-12-27 NOTE — Telephone Encounter (Signed)
Pt's voice mail not set up unable to leave a message.

## 2015-01-15 ENCOUNTER — Other Ambulatory Visit: Payer: Self-pay | Admitting: Women's Health

## 2015-01-15 ENCOUNTER — Ambulatory Visit (INDEPENDENT_AMBULATORY_CARE_PROVIDER_SITE_OTHER): Payer: Medicaid Other | Admitting: Obstetrics & Gynecology

## 2015-01-15 ENCOUNTER — Ambulatory Visit (INDEPENDENT_AMBULATORY_CARE_PROVIDER_SITE_OTHER): Payer: Medicaid Other

## 2015-01-15 ENCOUNTER — Encounter: Payer: Self-pay | Admitting: Obstetrics & Gynecology

## 2015-01-15 VITALS — BP 100/60 | Wt 205.0 lb

## 2015-01-15 DIAGNOSIS — O4413 Placenta previa with hemorrhage, third trimester: Secondary | ICD-10-CM

## 2015-01-15 DIAGNOSIS — O4403 Placenta previa specified as without hemorrhage, third trimester: Secondary | ICD-10-CM

## 2015-01-15 DIAGNOSIS — O4420 Partial placenta previa NOS or without hemorrhage, unspecified trimester: Secondary | ICD-10-CM

## 2015-01-15 DIAGNOSIS — O09523 Supervision of elderly multigravida, third trimester: Secondary | ICD-10-CM

## 2015-01-15 DIAGNOSIS — O4400 Placenta previa specified as without hemorrhage, unspecified trimester: Secondary | ICD-10-CM

## 2015-01-15 DIAGNOSIS — O09893 Supervision of other high risk pregnancies, third trimester: Secondary | ICD-10-CM

## 2015-01-15 DIAGNOSIS — Z1389 Encounter for screening for other disorder: Secondary | ICD-10-CM

## 2015-01-15 DIAGNOSIS — Z331 Pregnant state, incidental: Secondary | ICD-10-CM

## 2015-01-15 LAB — POCT URINALYSIS DIPSTICK
Blood, UA: NEGATIVE
GLUCOSE UA: 1
Ketones, UA: NEGATIVE
Leukocytes, UA: NEGATIVE
Nitrite, UA: NEGATIVE
Protein, UA: NEGATIVE

## 2015-01-15 LAB — US OB FOLLOW UP

## 2015-01-15 NOTE — Progress Notes (Signed)
Pt denies any problems or concerns at this time.  

## 2015-01-15 NOTE — Progress Notes (Signed)
U/S(31+6wks)-Frank Breech active fetus BPP 8/8, fluid WNL AFI-10.4cm SDP-2.9cm, FHR-153 bpm, EFW 5 lb 1 oz (76th%tile), posterior Gr 1 placenta Tip appears marginal (if not partial) previa remains, cx appears closed (4+cm)

## 2015-01-15 NOTE — Progress Notes (Signed)
Sonogram is reviewed and read, report done, placenta previa remains   High Risk Pregnancy Diagnosis(es):   Posterior previa, partial, recheck at 35 weeks, Breech  [id number] 6232w6d Estimated Date of Delivery: 03/13/15  Blood pressure 100/60, weight 205 lb (92.987 kg), last menstrual period 05/30/2014.  Urinalysis: Negative   HPI: No bleeding     BP weight and urine results all reviewed and noted. Patient reports good fetal movement, denies any bleeding and no rupture of membranes symptoms or regular contractions.  Fundal Height:  32 Fetal Heart rate:  145 Edema:  none  Patient is without complaints. All questions were answered.  Assessment:  7832w6d,   Posterior partial previa  Medication(s) Plans:  No changes  Treatment Plan:  Need to re evaluate at 35 weeks or so, chance it could resolve  Follow up in 2 weeks for appointment for high risk OB care,

## 2015-01-31 ENCOUNTER — Encounter: Payer: Medicaid Other | Admitting: Obstetrics & Gynecology

## 2015-01-31 ENCOUNTER — Ambulatory Visit (INDEPENDENT_AMBULATORY_CARE_PROVIDER_SITE_OTHER): Payer: Medicaid Other | Admitting: Obstetrics & Gynecology

## 2015-01-31 VITALS — BP 108/72 | HR 68 | Wt 207.0 lb

## 2015-01-31 DIAGNOSIS — Z1389 Encounter for screening for other disorder: Secondary | ICD-10-CM

## 2015-01-31 DIAGNOSIS — O321XX1 Maternal care for breech presentation, fetus 1: Secondary | ICD-10-CM

## 2015-01-31 DIAGNOSIS — O4400 Placenta previa specified as without hemorrhage, unspecified trimester: Secondary | ICD-10-CM

## 2015-01-31 DIAGNOSIS — O09893 Supervision of other high risk pregnancies, third trimester: Secondary | ICD-10-CM

## 2015-01-31 DIAGNOSIS — Z331 Pregnant state, incidental: Secondary | ICD-10-CM

## 2015-01-31 DIAGNOSIS — O4420 Partial placenta previa NOS or without hemorrhage, unspecified trimester: Secondary | ICD-10-CM

## 2015-01-31 DIAGNOSIS — R768 Other specified abnormal immunological findings in serum: Secondary | ICD-10-CM

## 2015-01-31 LAB — POCT URINALYSIS DIPSTICK
Blood, UA: NEGATIVE
Glucose, UA: 2
KETONES UA: NEGATIVE
Leukocytes, UA: NEGATIVE
Nitrite, UA: NEGATIVE
PROTEIN UA: NEGATIVE

## 2015-01-31 MED ORDER — ACYCLOVIR 400 MG PO TABS: 400.0000 mg | ORAL_TABLET | Freq: Three times a day (TID) | ORAL | Status: DC

## 2015-01-31 NOTE — Progress Notes (Signed)
Fetal Surveillance Testing today:  No testing today   High Risk Pregnancy Diagnosis(es):   Placenta previa, partial, posterior, Breech presentation  [id number] 8570w1d Estimated Date of Delivery: 03/13/15  Blood pressure 108/72, pulse 68, weight 207 lb (93.895 kg), last menstrual period 05/30/2014.  Urinalysis: Negative   HPI: The patient is being seen today for ongoing management of Placenta previa, breech. Today she reports no bleeding, back and pelvic pressure   BP weight and urine results all reviewed and noted. Patient reports good fetal movement, denies any bleeding and no rupture of membranes symptoms or regular contractions.  Fundal Height:  155 Fetal Heart rate:  36 Edema:  none  Patient is without complaints other than noted in her HPI. All questions were answered.  All lab and sonogram results have been reviewed. Comments: abnormal: previous sonogram, previa + breech   Assessment:  1.  Pregnancy at 8670w1d,  Estimated Date of Delivery: 03/13/15 :  Back pelvic pain                        2.  Placenta previa, partial posterior                        3.  Breech presentation                         4.  HSV 2 positive  Medication(s) Plans:  Acyclovir 400 TID  Treatment Plan:  Sonogram 1 week  Follow up in 1 weeks for appointment for high risk OB care, sonogram

## 2015-02-06 ENCOUNTER — Ambulatory Visit (INDEPENDENT_AMBULATORY_CARE_PROVIDER_SITE_OTHER): Payer: Medicaid Other

## 2015-02-06 ENCOUNTER — Ambulatory Visit (INDEPENDENT_AMBULATORY_CARE_PROVIDER_SITE_OTHER): Payer: Medicaid Other | Admitting: Obstetrics & Gynecology

## 2015-02-06 ENCOUNTER — Other Ambulatory Visit: Payer: Self-pay | Admitting: Obstetrics & Gynecology

## 2015-02-06 ENCOUNTER — Encounter: Payer: Self-pay | Admitting: Obstetrics & Gynecology

## 2015-02-06 VITALS — BP 128/80 | HR 80 | Wt 207.4 lb

## 2015-02-06 DIAGNOSIS — O4420 Partial placenta previa NOS or without hemorrhage, unspecified trimester: Secondary | ICD-10-CM

## 2015-02-06 DIAGNOSIS — O4400 Placenta previa specified as without hemorrhage, unspecified trimester: Secondary | ICD-10-CM

## 2015-02-06 DIAGNOSIS — O321XX1 Maternal care for breech presentation, fetus 1: Secondary | ICD-10-CM | POA: Diagnosis not present

## 2015-02-06 DIAGNOSIS — O09523 Supervision of elderly multigravida, third trimester: Secondary | ICD-10-CM

## 2015-02-06 DIAGNOSIS — O09893 Supervision of other high risk pregnancies, third trimester: Secondary | ICD-10-CM

## 2015-02-06 DIAGNOSIS — Z1389 Encounter for screening for other disorder: Secondary | ICD-10-CM

## 2015-02-06 DIAGNOSIS — Z331 Pregnant state, incidental: Secondary | ICD-10-CM

## 2015-02-06 LAB — POCT URINALYSIS DIPSTICK
Glucose, UA: NEGATIVE
Ketones, UA: NEGATIVE
LEUKOCYTES UA: NEGATIVE
Nitrite, UA: NEGATIVE
RBC UA: NEGATIVE

## 2015-02-06 LAB — US OB TRANSVAGINAL

## 2015-02-06 NOTE — Progress Notes (Signed)
U/S(35+0wks)-VTX active fetus, BPP 8/8, fluid WNL EFW 7 lb (84th%tile)AFI-11.8cm SDP- 4.4cm, FHR-140 bpm, Gr 2 placenta posterior(vaginal u/s)-placental tip? measures 1.1cm from int cx os

## 2015-02-06 NOTE — Progress Notes (Signed)
Fetal Surveillance Testing today:  Sonogram, see report   High Risk Pregnancy Diagnosis(es):   Marginal placenta previa, 1.1 cm from os  G4P1021 2112w0d Estimated Date of Delivery: 03/13/15  Blood pressure 128/80, pulse 80, weight 207 lb 6.4 oz (94.076 kg), last menstrual period 05/30/2014.  Urinalysis: Negative   HPI: The patient is being seen today for ongoing management of posterior previa, now marginal previa, 1.1 cm from os, also now cephalic. Today she reports no bleeding   BP weight and urine results all reviewed and noted. Patient reports good fetal movement, denies any bleeding and no rupture of membranes symptoms or regular contractions.  Fundal Height:  37 Fetal Heart rate:  140 Edema:  trace  Patient is without complaints other than noted in her HPI. All questions were answered.  All lab and sonogram results have been reviewed. Comments: abnormal: see report, I viewed images directly   Assessment:  1.  Pregnancy at 4012w0d,  Estimated Date of Delivery: 03/13/15 :                          2.  Marginal placenta previa                        3.  HSV2 positive  Medication(s) Plans:  Follow up sonogram 3 weeks to confirm,   Treatment Plan:  For now vaginal delivery is ok  Follow up in 2 weeks for appointment for high risk OB care,

## 2015-02-08 ENCOUNTER — Other Ambulatory Visit: Payer: Medicaid Other

## 2015-02-19 ENCOUNTER — Ambulatory Visit (INDEPENDENT_AMBULATORY_CARE_PROVIDER_SITE_OTHER): Payer: Medicaid Other | Admitting: Obstetrics & Gynecology

## 2015-02-19 ENCOUNTER — Encounter: Payer: Self-pay | Admitting: Obstetrics & Gynecology

## 2015-02-19 VITALS — BP 120/80 | HR 92 | Wt 210.0 lb

## 2015-02-19 DIAGNOSIS — O09893 Supervision of other high risk pregnancies, third trimester: Secondary | ICD-10-CM

## 2015-02-19 DIAGNOSIS — Z1159 Encounter for screening for other viral diseases: Secondary | ICD-10-CM

## 2015-02-19 DIAGNOSIS — Z118 Encounter for screening for other infectious and parasitic diseases: Secondary | ICD-10-CM

## 2015-02-19 DIAGNOSIS — O4420 Partial placenta previa NOS or without hemorrhage, unspecified trimester: Secondary | ICD-10-CM

## 2015-02-19 DIAGNOSIS — Z3685 Encounter for antenatal screening for Streptococcus B: Secondary | ICD-10-CM

## 2015-02-19 DIAGNOSIS — O4400 Placenta previa specified as without hemorrhage, unspecified trimester: Secondary | ICD-10-CM

## 2015-02-19 DIAGNOSIS — Z331 Pregnant state, incidental: Secondary | ICD-10-CM

## 2015-02-19 DIAGNOSIS — Z1389 Encounter for screening for other disorder: Secondary | ICD-10-CM

## 2015-02-19 LAB — OB RESULTS CONSOLE GC/CHLAMYDIA
CHLAMYDIA, DNA PROBE: NEGATIVE
Gonorrhea: NEGATIVE

## 2015-02-19 NOTE — Progress Notes (Signed)
  Fetal Surveillance Testing today:  None today   High Risk Pregnancy Diagnosis(es):   Low lying placenta 1.1 cm from os, resolved partial and marginal  G4P1021 2081w6d Estimated Date of Delivery: 03/13/15  Blood pressure 120/80, pulse 92, weight 210 lb (95.255 kg), last menstrual period 05/30/2014.  Urinalysis: Negative   HPI: The patient is being seen today for ongoing management of placentation issues. Today she reports no bleeding just pelvic and back pressure, resolved rash and itching   BP weight and urine results all reviewed and noted. Patient reports good fetal movement, denies any bleeding and no rupture of membranes symptoms or regular contractions.  Fundal Height:  37 Fetal Heart rate:  137 Edema:  1+  Patient is without complaints other than noted in her HPI. All questions were answered.  All lab and sonogram results have been reviewed. Comments: abnormal: placenta 1.1 cm from os posteriorly   Assessment:  1.  Pregnancy at 4881w6d,  Estimated Date of Delivery: 03/13/15 :                          2.  Low lying placenta                        3.    Medication(s) Plans:  No changes  Treatment Plan:  Pelvic rest  Follow up in 1 weeks for appointment for high risk OB care,

## 2015-02-22 LAB — GC/CHLAMYDIA PROBE AMP
CHLAMYDIA, DNA PROBE: NEGATIVE
Neisseria gonorrhoeae by PCR: NEGATIVE

## 2015-02-23 LAB — CULTURE, BETA STREP (GROUP B ONLY): Strep Gp B Culture: NEGATIVE

## 2015-02-26 ENCOUNTER — Encounter: Payer: Self-pay | Admitting: Obstetrics & Gynecology

## 2015-02-26 ENCOUNTER — Ambulatory Visit (INDEPENDENT_AMBULATORY_CARE_PROVIDER_SITE_OTHER): Payer: Medicaid Other | Admitting: Obstetrics & Gynecology

## 2015-02-26 VITALS — BP 100/60 | HR 68 | Wt 215.0 lb

## 2015-02-26 DIAGNOSIS — Z331 Pregnant state, incidental: Secondary | ICD-10-CM

## 2015-02-26 DIAGNOSIS — O09893 Supervision of other high risk pregnancies, third trimester: Secondary | ICD-10-CM

## 2015-02-26 DIAGNOSIS — O4420 Partial placenta previa NOS or without hemorrhage, unspecified trimester: Secondary | ICD-10-CM

## 2015-02-26 DIAGNOSIS — O4410 Placenta previa with hemorrhage, unspecified trimester: Secondary | ICD-10-CM

## 2015-02-26 DIAGNOSIS — Z1389 Encounter for screening for other disorder: Secondary | ICD-10-CM

## 2015-02-26 LAB — POCT URINALYSIS DIPSTICK
Blood, UA: NEGATIVE
Blood, UA: NEGATIVE
Glucose, UA: NEGATIVE
Glucose, UA: NEGATIVE
KETONES UA: NEGATIVE
Ketones, UA: NEGATIVE
LEUKOCYTES UA: NEGATIVE
LEUKOCYTES UA: NEGATIVE
NITRITE UA: NEGATIVE
Nitrite, UA: NEGATIVE
PROTEIN UA: NEGATIVE
Protein, UA: NEGATIVE

## 2015-02-26 NOTE — Progress Notes (Signed)
Fetal Surveillance Testing today:  none   High Risk Pregnancy Diagnosis(es):   Marginal Previa resolved, now low lying  G4P1021 1067w6d Estimated Date of Delivery: 03/13/15  Blood pressure 100/60, pulse 68, weight 215 lb (97.523 kg), last menstrual period 05/30/2014.  Urinalysis: Negative   HPI: The patient is being seen today for ongoing management of marginal previa. Today she reports no bleeding   BP weight and urine results all reviewed and noted. Patient reports good fetal movement, denies any bleeding and no rupture of membranes symptoms or regular contractions.  Fundal Height:  39 Fetal Heart rate:  146 Edema:  1+  Patient is without complaints other than noted in her HPI. All questions were answered.  All lab and sonogram results have been reviewed. Comments: abnormal: low lying 1.1 cm from os   Assessment:  1.  Pregnancy at 5767w6d,  Estimated Date of Delivery: 03/13/15 :                          2.  Marginal previa, now low lying, recheck next week                        3.  AMA  Medication(s) Plans:  continue acyclovir  Treatment Plan:  As outlined  Follow up in 1 weeks for appointment for high risk OB care, sonogram

## 2015-03-01 ENCOUNTER — Inpatient Hospital Stay (HOSPITAL_COMMUNITY)
Admission: AD | Admit: 2015-03-01 | Discharge: 2015-03-01 | Disposition: A | Discharge status: Home / Self Care | Payer: Medicaid Other | Source: Ambulatory Visit | Attending: Obstetrics & Gynecology | Admitting: Obstetrics & Gynecology

## 2015-03-01 DIAGNOSIS — O09523 Supervision of elderly multigravida, third trimester: Secondary | ICD-10-CM | POA: Insufficient documentation

## 2015-03-01 LAB — OB RESULTS CONSOLE GBS: GBS: NEGATIVE

## 2015-03-01 NOTE — MAU Note (Signed)
Contractions every 20 minutes since 3 am. Denies LOF. Some bloody show. Cervix was closed 2 weeks ago. Had placenta previa, was told she could have vaginal delivery after the last ultrasound. Taking valtrex for HSV, last outbreak 2 months ago; denies current outbreak. Positive fetal movement.

## 2015-03-01 NOTE — Discharge Instructions (Signed)
Braxton Hicks Contractions °Contractions of the uterus can occur throughout pregnancy. Contractions are not always a sign that you are in labor.  °WHAT ARE BRAXTON HICKS CONTRACTIONS?  °Contractions that occur before labor are called Braxton Hicks contractions, or false labor. Toward the end of pregnancy (32-34 weeks), these contractions can develop more often and may become more forceful. This is not true labor because these contractions do not result in opening (dilatation) and thinning of the cervix. They are sometimes difficult to tell apart from true labor because these contractions can be forceful and people have different pain tolerances. You should not feel embarrassed if you go to the hospital with false labor. Sometimes, the only way to tell if you are in true labor is for your health care provider to look for changes in the cervix. °If there are no prenatal problems or other health problems associated with the pregnancy, it is completely safe to be sent home with false labor and await the onset of true labor. °HOW CAN YOU TELL THE DIFFERENCE BETWEEN TRUE AND FALSE LABOR? °False Labor °· The contractions of false labor are usually shorter and not as hard as those of true labor.   °· The contractions are usually irregular.   °· The contractions are often felt in the front of the lower abdomen and in the groin.   °· The contractions may go away when you walk around or change positions while lying down.   °· The contractions get weaker and are shorter lasting as time goes on.   °· The contractions do not usually become progressively stronger, regular, and closer together as with true labor.   °True Labor °1. Contractions in true labor last 30-70 seconds, become very regular, usually become more intense, and increase in frequency.   °2. The contractions do not go away with walking.   °3. The discomfort is usually felt in the top of the uterus and spreads to the lower abdomen and low back.   °4. True labor can  be determined by your health care provider with an exam. This will show that the cervix is dilating and getting thinner.   °WHAT TO REMEMBER °· Keep up with your usual exercises and follow other instructions given by your health care provider.   °· Take medicines as directed by your health care provider.   °· Keep your regular prenatal appointments.   °· Eat and drink lightly if you think you are going into labor.   °· If Braxton Hicks contractions are making you uncomfortable:   °· Change your position from lying down or resting to walking, or from walking to resting.   °· Sit and rest in a tub of warm water.   °· Drink 2-3 glasses of water. Dehydration may cause these contractions.   °· Do slow and deep breathing several times an hour.   °WHEN SHOULD I SEEK IMMEDIATE MEDICAL CARE? °Seek immediate medical care if: °· Your contractions become stronger, more regular, and closer together.   °· You have fluid leaking or gushing from your vagina.   °· You have a fever.   °· You pass blood-tinged mucus.   °· You have vaginal bleeding.   °· You have continuous abdominal pain.   °· You have low back pain that you never had before.   °· You feel your baby's head pushing down and causing pelvic pressure.   °· Your baby is not moving as much as it used to.   °Document Released: 11/16/2005 Document Revised: 11/21/2013 Document Reviewed: 08/28/2013 °[employer]® Patient Information ©2015 [employer]. This information is not intended to replace advice given to you by your health care   provider. Make sure you discuss any questions you have with your health care provider. ° °Fetal Movement Counts °Patient Name: __________________________________________________ Patient Due Date: ____________________ °Performing a fetal movement count is highly recommended in high-risk pregnancies, but it is good for every pregnant woman to do. Your health care provider may ask you to start counting fetal movements at 28 weeks of the pregnancy. Fetal  movements often increase: °· After eating a full meal. °· After physical activity. °· After eating or drinking something sweet or cold. °· At rest. °Pay attention to when you feel the baby is most active. This will help you notice a pattern of your baby's sleep and wake cycles and what factors contribute to an increase in fetal movement. It is important to perform a fetal movement count at the same time each day when your baby is normally most active.  °HOW TO COUNT FETAL MOVEMENTS °5. Find a quiet and comfortable area to sit or lie down on your left side. Lying on your left side provides the best blood and oxygen circulation to your baby. °6. Write down the day and time on a sheet of paper or in a journal. °7. Start counting kicks, flutters, swishes, rolls, or jabs in a 2-hour period. You should feel at least 10 movements within 2 hours. °8. If you do not feel 10 movements in 2 hours, wait 2-3 hours and count again. Look for a change in the pattern or not enough counts in 2 hours. °SEEK MEDICAL CARE IF: °· You feel less than 10 counts in 2 hours, tried twice. °· There is no movement in over an hour. °· The pattern is changing or taking longer each day to reach 10 counts in 2 hours. °· You feel the baby is not moving as he or she usually does. °Date: ____________ Movements: ____________ Start time: ____________ Finish time: ____________  °Date: ____________ Movements: ____________ Start time: ____________ Finish time: ____________ °Date: ____________ Movements: ____________ Start time: ____________ Finish time: ____________ °Date: ____________ Movements: ____________ Start time: ____________ Finish time: ____________ °Date: ____________ Movements: ____________ Start time: ____________ Finish time: ____________ °Date: ____________ Movements: ____________ Start time: ____________ Finish time: ____________ °Date: ____________ Movements: ____________ Start time: ____________ Finish time: ____________ °Date: ____________  Movements: ____________ Start time: ____________ Finish time: ____________  °Date: ____________ Movements: ____________ Start time: ____________ Finish time: ____________ °Date: ____________ Movements: ____________ Start time: ____________ Finish time: ____________ °Date: ____________ Movements: ____________ Start time: ____________ Finish time: ____________ °Date: ____________ Movements: ____________ Start time: ____________ Finish time: ____________ °Date: ____________ Movements: ____________ Start time: ____________ Finish time: ____________ °Date: ____________ Movements: ____________ Start time: ____________ Finish time: ____________ °Date: ____________ Movements: ____________ Start time: ____________ Finish time: ____________  °Date: ____________ Movements: ____________ Start time: ____________ Finish time: ____________ °Date: ____________ Movements: ____________ Start time: ____________ Finish time: ____________ °Date: ____________ Movements: ____________ Start time: ____________ Finish time: ____________ °Date: ____________ Movements: ____________ Start time: ____________ Finish time: ____________ °Date: ____________ Movements: ____________ Start time: ____________ Finish time: ____________ °Date: ____________ Movements: ____________ Start time: ____________ Finish time: ____________ °Date: ____________ Movements: ____________ Start time: ____________ Finish time: ____________  °Date: ____________ Movements: ____________ Start time: ____________ Finish time: ____________ °Date: ____________ Movements: ____________ Start time: ____________ Finish time: ____________ °Date: ____________ Movements: ____________ Start time: ____________ Finish time: ____________ °Date: ____________ Movements: ____________ Start time: ____________ Finish time: ____________ °Date: ____________ Movements: ____________ Start time: ____________ Finish time: ____________ °Date: ____________ Movements: ____________ Start time:  ____________ Finish time: ____________ °Date: ____________ Movements:   ____________ Start time: ____________ Finish time: ____________  °Date: ____________ Movements: ____________ Start time: ____________ Finish time: ____________ °Date: ____________ Movements: ____________ Start time: ____________ Finish time: ____________ °Date: ____________ Movements: ____________ Start time: ____________ Finish time: ____________ °Date: ____________ Movements: ____________ Start time: ____________ Finish time: ____________ °Date: ____________ Movements: ____________ Start time: ____________ Finish time: ____________ °Date: ____________ Movements: ____________ Start time: ____________ Finish time: ____________ °Date: ____________ Movements: ____________ Start time: ____________ Finish time: ____________  °Date: ____________ Movements: ____________ Start time: ____________ Finish time: ____________ °Date: ____________ Movements: ____________ Start time: ____________ Finish time: ____________ °Date: ____________ Movements: ____________ Start time: ____________ Finish time: ____________ °Date: ____________ Movements: ____________ Start time: ____________ Finish time: ____________ °Date: ____________ Movements: ____________ Start time: ____________ Finish time: ____________ °Date: ____________ Movements: ____________ Start time: ____________ Finish time: ____________ °Date: ____________ Movements: ____________ Start time: ____________ Finish time: ____________  °Date: ____________ Movements: ____________ Start time: ____________ Finish time: ____________ °Date: ____________ Movements: ____________ Start time: ____________ Finish time: ____________ °Date: ____________ Movements: ____________ Start time: ____________ Finish time: ____________ °Date: ____________ Movements: ____________ Start time: ____________ Finish time: ____________ °Date: ____________ Movements: ____________ Start time: ____________ Finish time: ____________ °Date:  ____________ Movements: ____________ Start time: ____________ Finish time: ____________ °Date: ____________ Movements: ____________ Start time: ____________ Finish time: ____________  °Date: ____________ Movements: ____________ Start time: ____________ Finish time: ____________ °Date: ____________ Movements: ____________ Start time: ____________ Finish time: ____________ °Date: ____________ Movements: ____________ Start time: ____________ Finish time: ____________ °Date: ____________ Movements: ____________ Start time: ____________ Finish time: ____________ °Date: ____________ Movements: ____________ Start time: ____________ Finish time: ____________ °Date: ____________ Movements: ____________ Start time: ____________ Finish time: ____________ °Document Released: 12/16/2006 Document Revised: 04/02/2014 Document Reviewed: 09/12/2012 °[employer]® Patient Information ©2015 [employer]. This information is not intended to replace advice given to you by your health care provider. Make sure you discuss any questions you have with your health care provider. ° °

## 2015-03-07 ENCOUNTER — Ambulatory Visit (INDEPENDENT_AMBULATORY_CARE_PROVIDER_SITE_OTHER): Payer: Medicaid Other | Admitting: Advanced Practice Midwife

## 2015-03-07 ENCOUNTER — Encounter: Payer: Self-pay | Admitting: Advanced Practice Midwife

## 2015-03-07 ENCOUNTER — Other Ambulatory Visit: Payer: Self-pay | Admitting: Obstetrics & Gynecology

## 2015-03-07 ENCOUNTER — Ambulatory Visit (INDEPENDENT_AMBULATORY_CARE_PROVIDER_SITE_OTHER): Payer: Medicaid Other

## 2015-03-07 VITALS — BP 120/78 | HR 80

## 2015-03-07 DIAGNOSIS — B009 Herpesviral infection, unspecified: Secondary | ICD-10-CM

## 2015-03-07 DIAGNOSIS — O4410 Placenta previa with hemorrhage, unspecified trimester: Secondary | ICD-10-CM

## 2015-03-07 DIAGNOSIS — O4420 Partial placenta previa NOS or without hemorrhage, unspecified trimester: Secondary | ICD-10-CM

## 2015-03-07 DIAGNOSIS — O4403 Placenta previa specified as without hemorrhage, third trimester: Secondary | ICD-10-CM | POA: Diagnosis not present

## 2015-03-07 DIAGNOSIS — Z331 Pregnant state, incidental: Secondary | ICD-10-CM

## 2015-03-07 DIAGNOSIS — O09523 Supervision of elderly multigravida, third trimester: Secondary | ICD-10-CM

## 2015-03-07 DIAGNOSIS — O09893 Supervision of other high risk pregnancies, third trimester: Secondary | ICD-10-CM

## 2015-03-07 DIAGNOSIS — N9089 Other specified noninflammatory disorders of vulva and perineum: Secondary | ICD-10-CM

## 2015-03-07 DIAGNOSIS — Z1389 Encounter for screening for other disorder: Secondary | ICD-10-CM

## 2015-03-07 LAB — POCT URINALYSIS DIPSTICK
Blood, UA: NEGATIVE
Glucose, UA: NEGATIVE
KETONES UA: NEGATIVE
Leukocytes, UA: NEGATIVE
Nitrite, UA: NEGATIVE
PROTEIN UA: NEGATIVE

## 2015-03-07 NOTE — Progress Notes (Signed)
US post pl,limited view of tip of pl,no previa seen, cx .5cm w funneling, fht 152bpm,cephalic, afi 12cm,EFW 3736g ([id number])

## 2015-03-07 NOTE — Patient Instructions (Signed)

## 2015-03-07 NOTE — Progress Notes (Signed)
Pt denies any problems or concerns at this time.  

## 2015-03-07 NOTE — Progress Notes (Signed)
Fetal Surveillance Testing today:  US:     High Risk Pregnancy Diagnosis(es):  AMA;  Placenta previa, now resolved  G4P1021 3141w1d Estimated Date of Delivery: 03/13/15  Last menstrual period 05/30/2014.  Urinalysis: Negative   HPI: The patient is being seen today for ongoing management of placenta previa, AMA. Today she reports pressure.    BP weight and urine results all reviewed and noted. Patient reports good fetal movement, denies any bleeding and no rupture of membranes symptoms or regular contractions.  Edema:  trace  Patient is without complaints other than noted in her HPI. All questions were answered.  All lab and sonogram results have been reviewed. Comments: US post pl,limited view of tip of pl,no previa seen, cx .5cm w funneling, fht 152bpm,cephalic, afi 12cm,EFW 3736g ([id number])  Assessment:  1.  Pregnancy at 4141w1d,  Estimated Date of Delivery: 03/13/15 :                          2.  AMA                        3.  Previa, resolved  Medication(s) Plans:  none  Treatment Plan:  Weekly visits  Follow up in 1 weeks for appointment for high risk OB care,

## 2015-03-11 ENCOUNTER — Encounter (HOSPITAL_COMMUNITY): Payer: Self-pay | Admitting: *Deleted

## 2015-03-11 ENCOUNTER — Inpatient Hospital Stay (HOSPITAL_COMMUNITY)
Admission: AD | Admit: 2015-03-11 | Discharge: 2015-03-15 | DRG: 765 | Disposition: A | Discharge status: Home / Self Care | Payer: Medicaid Other | Source: Ambulatory Visit | Attending: Family Medicine | Admitting: Family Medicine

## 2015-03-11 DIAGNOSIS — A630 Anogenital (venereal) warts: Secondary | ICD-10-CM | POA: Diagnosis present

## 2015-03-11 DIAGNOSIS — O4292 Full-term premature rupture of membranes, unspecified as to length of time between rupture and onset of labor: Secondary | ICD-10-CM

## 2015-03-11 DIAGNOSIS — O4290 Premature rupture of membranes, unspecified as to length of time between rupture and onset of labor, unspecified weeks of gestation: Secondary | ICD-10-CM | POA: Diagnosis present

## 2015-03-11 DIAGNOSIS — Z3A39 39 weeks gestation of pregnancy: Secondary | ICD-10-CM | POA: Diagnosis present

## 2015-03-11 DIAGNOSIS — Z87891 Personal history of nicotine dependence: Secondary | ICD-10-CM | POA: Diagnosis not present

## 2015-03-11 DIAGNOSIS — O09523 Supervision of elderly multigravida, third trimester: Secondary | ICD-10-CM

## 2015-03-11 DIAGNOSIS — O4420 Partial placenta previa NOS or without hemorrhage, unspecified trimester: Secondary | ICD-10-CM

## 2015-03-11 DIAGNOSIS — Z23 Encounter for immunization: Secondary | ICD-10-CM

## 2015-03-11 DIAGNOSIS — B009 Herpesviral infection, unspecified: Secondary | ICD-10-CM

## 2015-03-11 DIAGNOSIS — O9832 Other infections with a predominantly sexual mode of transmission complicating childbirth: Secondary | ICD-10-CM | POA: Diagnosis present

## 2015-03-11 DIAGNOSIS — A6000 Herpesviral infection of urogenital system, unspecified: Secondary | ICD-10-CM | POA: Diagnosis present

## 2015-03-11 LAB — CBC
HCT: 38.4 % (ref 36.0–46.0)
HEMOGLOBIN: 12.9 g/dL (ref 12.0–15.0)
MCH: 31.9 pg (ref 26.0–34.0)
MCHC: 33.6 g/dL (ref 30.0–36.0)
MCV: 95 fL (ref 78.0–100.0)
PLATELETS: 219 10*3/uL (ref 150–400)
RBC: 4.04 MIL/uL (ref 3.87–5.11)
RDW: 14.9 % (ref 11.5–15.5)
WBC: 10.7 10*3/uL — AB (ref 4.0–10.5)

## 2015-03-11 LAB — AMNISURE RUPTURE OF MEMBRANE (ROM) NOT AT ARMC: AMNISURE: POSITIVE

## 2015-03-11 LAB — POCT FERN TEST: POCT FERN TEST: NEGATIVE

## 2015-03-11 MED ORDER — OXYCODONE-ACETAMINOPHEN 5-325 MG PO TABS: 2.0000 | ORAL_TABLET | ORAL | Status: DC | PRN

## 2015-03-11 MED ORDER — VALACYCLOVIR HCL 500 MG PO TABS
1000.0000 mg | ORAL_TABLET | Freq: Every day | ORAL | Status: DC
  Administered 2015-03-12: 1000 mg via ORAL
  Filled 2015-03-11 (×2): qty 2

## 2015-03-11 MED ORDER — LACTATED RINGERS IV SOLN
500.0000 mL | INTRAVENOUS | Status: DC | PRN
  Administered 2015-03-12: 500 mL via INTRAVENOUS

## 2015-03-11 MED ORDER — ONDANSETRON HCL 4 MG/2ML IJ SOLN
4.0000 mg | Freq: Four times a day (QID) | INTRAMUSCULAR | Status: DC | PRN
Start: 2015-03-11 — End: 2015-03-12
  Administered 2015-03-12: 4 mg via INTRAVENOUS
  Filled 2015-03-11: qty 2

## 2015-03-11 MED ORDER — CITRIC ACID-SODIUM CITRATE 334-500 MG/5ML PO SOLN
30.0000 mL | ORAL | Status: DC | PRN
  Administered 2015-03-12: 30 mL via ORAL
  Filled 2015-03-11: qty 15

## 2015-03-11 MED ORDER — OXYTOCIN BOLUS FROM INFUSION: 500.0000 mL | INTRAVENOUS | Status: DC

## 2015-03-11 MED ORDER — TERBUTALINE SULFATE 1 MG/ML IJ SOLN: 0.2500 mg | Freq: Once | INTRAMUSCULAR | Status: AC | PRN

## 2015-03-11 MED ORDER — OXYTOCIN 40 UNITS IN LACTATED RINGERS INFUSION - SIMPLE MED
62.5000 mL/h | INTRAVENOUS | Status: DC
  Filled 2015-03-11: qty 1000

## 2015-03-11 MED ORDER — MISOPROSTOL 25 MCG QUARTER TABLET
25.0000 ug | ORAL_TABLET | ORAL | Status: DC | PRN
  Administered 2015-03-11: 25 ug via ORAL
  Filled 2015-03-11: qty 0.25

## 2015-03-11 MED ORDER — LACTATED RINGERS IV SOLN
INTRAVENOUS | Status: DC
  Administered 2015-03-11 – 2015-03-12 (×4): via INTRAVENOUS

## 2015-03-11 MED ORDER — LIDOCAINE HCL (PF) 1 % IJ SOLN
30.0000 mL | INTRAMUSCULAR | Status: DC | PRN
  Filled 2015-03-11: qty 30

## 2015-03-11 MED ORDER — OXYCODONE-ACETAMINOPHEN 5-325 MG PO TABS: 1.0000 | ORAL_TABLET | ORAL | Status: DC | PRN

## 2015-03-11 MED ORDER — ACETAMINOPHEN 325 MG PO TABS: 650.0000 mg | ORAL_TABLET | ORAL | Status: DC | PRN

## 2015-03-11 NOTE — H&P (Cosign Needed)
Danella Sensingngrid Eves is a 39 y.o. female [phone] at 6872w5d presenting for leaking fluid since 7am today. Pt reports at 7am she lost her mucus plug and then she had clear watery discharge which continued throughout the day. In the afternoon she had a bigger gush so she called her doctor who told her to come in. She denies bleeding, contractions. Normal FM. History OB History    Gravida Para Term Preterm AB TAB SAB Ectopic Multiple Living   4 1 1  0 2 0 2 0 0 1     Past Medical History  Diagnosis Date  . Abnormal Pap smear of cervix   . HPV (human papilloma virus) infection   . Labial irritation 01/12/2014    ?herpes  . Contraceptive management 01/12/2014  . Herpes 04/17/2014  . Pregnant state, incidental 07/04/2014  . Pelvic pain in pregnant patient at less than [redacted] weeks gestation 07/04/2014  . Gestational diabetes     with previous pregnancy   Past Surgical History  Procedure Laterality Date  . Colposcopy  09/2013  . Cervical biopsy    . Tonsillectomy     Family History: family history includes Diabetes in her maternal grandmother and mother. Social History:  reports that she has quit smoking. She has never used smokeless tobacco. She reports that she does not drink alcohol or use illicit drugs.   Prenatal Transfer Tool  Maternal Diabetes: No Genetic Screening: Normal Maternal Ultrasounds/Referrals: Abnormal:  Findings:   Other:marginal previa resolved at 39w. Fetal Ultrasounds or other Referrals:  None Maternal Substance Abuse:  No Significant Maternal Medications:  None Significant Maternal Lab Results:  Lab values include: Group B Strep negative Other Comments:  None  ROS See HPI  Dilation: Fingertip Effacement (%): Thick Station: Ballotable Exam by:: Dr Richarda BladeAdamo Blood pressure 114/77, pulse 97, temperature 97.9 F (36.6 C), temperature source Oral, resp. rate 18, height 5\' 5"  (1.651 m), weight 97.07 kg (214 lb), last menstrual period 05/30/2014, SpO2 100 %. Maternal Exam:   Uterine Assessment: Contraction strength is mild.  Contraction duration is 60 seconds. Contraction frequency is irregular.   Abdomen: Patient reports no abdominal tenderness. Fetal presentation: vertex  Introitus: Normal vulva. Normal vagina.  Vagina is negative for discharge.  Ferning test: negative.  Amniotic fluid character: clear.  Pelvis: adequate for delivery.   Cervix: Cervix evaluated by sterile speculum exam and digital exam.     Fetal Exam Fetal Monitor Review: Mode: ultrasound.   Baseline rate: 140.  Variability: moderate (6-25 bpm).   Pattern: accelerations present and no decelerations.    Fetal State Assessment: Category I - tracings are normal.     Physical Exam  Nursing note and vitals reviewed. Constitutional: She is oriented to person, place, and time. She appears well-developed and well-nourished. No distress.  HENT:  Head: Normocephalic and atraumatic.  Eyes: Conjunctivae are normal. Right eye exhibits no discharge. Left eye exhibits no discharge.  Cardiovascular: Normal rate.   Respiratory: Effort normal. No respiratory distress.  GI: Soft. She exhibits no distension. There is no tenderness.  Genitourinary: Vagina normal and uterus normal. No vaginal discharge found.  Neurological: She is alert and oriented to person, place, and time.  Skin: Skin is warm and dry. She is not diaphoretic.  Psychiatric: She has a normal mood and affect. Her behavior is normal.    Prenatal labs: ABO, Rh: O/POS/-- (08/31 1030) Antibody: NEG (01/19 1109) Rubella: 6.73 (08/31 1030) RPR: NON REAC (01/19 1109)  HBsAg: NEGATIVE (08/31 1030)  HIV: NONREACTIVE (01/19  1109)  GBS: Negative (04/01 0000)   Assessment/Plan: Labor Plan #Labor: No significant contractions, PROM 12 hours ago, cervix closed, will start with oral cytotec #Pain: epidural on request #FWB: category 1 #ID: GBS neg   Beverely Low 03/11/2015, 9:56 PM   OB fellow attestation:  I have seen and  examined this patient; I agree with above documentation in the resident's note.   Zayanna Pundt is a 39 y.o. [phone] here for ROM at term without evidence of labor.   PE: BP 114/77 mmHg  Pulse 97  Temp(Src) 97.9 F (36.6 C) (Oral)  Resp 18  Ht  (1.651 m)  Wt 214 lb (97.07 kg)  BMI 35.61 kg/m2  SpO2 100%  LMP 05/30/2014 Gen: calm comfortable, NAD Resp: normal effort, no distress Abd: gravid  ROS, labs, PMH reviewed  Plan: MOF: Breast ID: GBS negative FWB: Category I Labor: Induction with cytotec.  Pain: Desires epidural  William Dalton 03/11/2015, 10:52 PM

## 2015-03-11 NOTE — MAU Note (Signed)
Pt reports leaking fluid since 7 am, denies contractions.

## 2015-03-12 ENCOUNTER — Encounter (HOSPITAL_COMMUNITY)
Admission: AD | Disposition: A | Discharge status: Home / Self Care | Payer: Self-pay | Source: Ambulatory Visit | Attending: Family Medicine

## 2015-03-12 ENCOUNTER — Encounter: Payer: Medicaid Other | Admitting: Obstetrics & Gynecology

## 2015-03-12 ENCOUNTER — Inpatient Hospital Stay (HOSPITAL_COMMUNITY): Payer: Medicaid Other | Admitting: Anesthesiology

## 2015-03-12 ENCOUNTER — Encounter (HOSPITAL_COMMUNITY): Payer: Self-pay | Admitting: Anesthesiology

## 2015-03-12 DIAGNOSIS — O09523 Supervision of elderly multigravida, third trimester: Secondary | ICD-10-CM

## 2015-03-12 DIAGNOSIS — O9832 Other infections with a predominantly sexual mode of transmission complicating childbirth: Secondary | ICD-10-CM

## 2015-03-12 LAB — TYPE AND SCREEN
ABO/RH(D): O POS
ANTIBODY SCREEN: NEGATIVE

## 2015-03-12 LAB — ABO/RH: ABO/RH(D): O POS

## 2015-03-12 LAB — RPR: RPR Ser Ql: NONREACTIVE

## 2015-03-12 SURGERY — Surgical Case
Anesthesia: Epidural

## 2015-03-12 MED ORDER — SODIUM CHLORIDE 0.9 % IJ SOLN: 3.0000 mL | INTRAMUSCULAR | Status: DC | PRN

## 2015-03-12 MED ORDER — NALOXONE HCL 1 MG/ML IJ SOLN: 1.0000 ug/kg/h | INTRAVENOUS | Status: DC | PRN

## 2015-03-12 MED ORDER — DIBUCAINE 1 % RE OINT: 1.0000 "application " | TOPICAL_OINTMENT | RECTAL | Status: DC | PRN

## 2015-03-12 MED ORDER — PRENATAL MULTIVITAMIN CH
1.0000 | ORAL_TABLET | Freq: Every day | ORAL | Status: DC
  Administered 2015-03-13 – 2015-03-15 (×3): 1 via ORAL
  Filled 2015-03-12 (×3): qty 1

## 2015-03-12 MED ORDER — KETOROLAC TROMETHAMINE 30 MG/ML IJ SOLN: 30.0000 mg | Freq: Once | INTRAMUSCULAR | Status: DC | PRN

## 2015-03-12 MED ORDER — NALOXONE HCL 0.4 MG/ML IJ SOLN: 0.4000 mg | INTRAMUSCULAR | Status: DC | PRN

## 2015-03-12 MED ORDER — BUPIVACAINE HCL (PF) 0.25 % IJ SOLN
INTRAMUSCULAR | Status: DC | PRN
  Administered 2015-03-12: 30 mL

## 2015-03-12 MED ORDER — DIPHENHYDRAMINE HCL 50 MG/ML IJ SOLN: 12.5000 mg | INTRAMUSCULAR | Status: DC | PRN

## 2015-03-12 MED ORDER — OXYCODONE-ACETAMINOPHEN 5-325 MG PO TABS
1.0000 | ORAL_TABLET | ORAL | Status: DC | PRN
  Administered 2015-03-13 – 2015-03-14 (×2): 1 via ORAL
  Filled 2015-03-12 (×2): qty 1

## 2015-03-12 MED ORDER — OXYTOCIN 40 UNITS IN LACTATED RINGERS INFUSION - SIMPLE MED
1.0000 m[IU]/min | INTRAVENOUS | Status: DC
  Administered 2015-03-12: 2 m[IU]/min via INTRAVENOUS

## 2015-03-12 MED ORDER — ONDANSETRON HCL 4 MG/2ML IJ SOLN
INTRAMUSCULAR | Status: AC
  Filled 2015-03-12: qty 2

## 2015-03-12 MED ORDER — CEFAZOLIN SODIUM-DEXTROSE 2-3 GM-% IV SOLR
INTRAVENOUS | Status: DC | PRN
  Administered 2015-03-12: 2 g via INTRAVENOUS

## 2015-03-12 MED ORDER — LANOLIN HYDROUS EX OINT: 1.0000 "application " | TOPICAL_OINTMENT | CUTANEOUS | Status: DC | PRN

## 2015-03-12 MED ORDER — MEPERIDINE HCL 25 MG/ML IJ SOLN: 6.2500 mg | INTRAMUSCULAR | Status: DC | PRN

## 2015-03-12 MED ORDER — ONDANSETRON HCL 4 MG/2ML IJ SOLN
INTRAMUSCULAR | Status: DC | PRN
  Administered 2015-03-12: 4 mg via INTRAVENOUS

## 2015-03-12 MED ORDER — MEPERIDINE HCL 25 MG/ML IJ SOLN
INTRAMUSCULAR | Status: DC | PRN
  Administered 2015-03-12: 25 mg via INTRAVENOUS

## 2015-03-12 MED ORDER — FENTANYL CITRATE 0.05 MG/ML IJ SOLN
INTRAMUSCULAR | Status: AC
  Filled 2015-03-12: qty 2

## 2015-03-12 MED ORDER — NALBUPHINE HCL 10 MG/ML IJ SOLN: 5.0000 mg | Freq: Once | INTRAMUSCULAR | Status: AC | PRN

## 2015-03-12 MED ORDER — SIMETHICONE 80 MG PO CHEW
80.0000 mg | CHEWABLE_TABLET | ORAL | Status: DC
  Administered 2015-03-12 – 2015-03-14 (×3): 80 mg via ORAL
  Filled 2015-03-12 (×3): qty 1

## 2015-03-12 MED ORDER — TETANUS-DIPHTH-ACELL PERTUSSIS 5-2.5-18.5 LF-MCG/0.5 IM SUSP
0.5000 mL | Freq: Once | INTRAMUSCULAR | Status: AC
  Administered 2015-03-13: 0.5 mL via INTRAMUSCULAR
  Filled 2015-03-12: qty 0.5

## 2015-03-12 MED ORDER — FENTANYL CITRATE 0.05 MG/ML IJ SOLN
INTRAMUSCULAR | Status: DC | PRN
  Administered 2015-03-12: 50 ug via INTRAVENOUS
  Administered 2015-03-12: 100 ug via INTRAVENOUS
  Administered 2015-03-12: 50 ug via INTRAVENOUS

## 2015-03-12 MED ORDER — MENTHOL 3 MG MT LOZG: 1.0000 | LOZENGE | OROMUCOSAL | Status: DC | PRN

## 2015-03-12 MED ORDER — TERBUTALINE SULFATE 1 MG/ML IJ SOLN: 0.2500 mg | Freq: Once | INTRAMUSCULAR | Status: DC | PRN

## 2015-03-12 MED ORDER — BUPIVACAINE HCL (PF) 0.25 % IJ SOLN
INTRAMUSCULAR | Status: AC
  Filled 2015-03-12: qty 20

## 2015-03-12 MED ORDER — BUPIVACAINE HCL (PF) 0.25 % IJ SOLN
INTRAMUSCULAR | Status: AC
  Filled 2015-03-12: qty 10

## 2015-03-12 MED ORDER — HYDROMORPHONE HCL 1 MG/ML IJ SOLN
0.2500 mg | INTRAMUSCULAR | Status: DC | PRN
  Administered 2015-03-12 (×2): 0.5 mg via INTRAVENOUS

## 2015-03-12 MED ORDER — FENTANYL 2.5 MCG/ML BUPIVACAINE 1/10 % EPIDURAL INFUSION (WH - ANES)
INTRAMUSCULAR | Status: DC | PRN
  Administered 2015-03-12: 14 mL/h via EPIDURAL

## 2015-03-12 MED ORDER — OXYCODONE-ACETAMINOPHEN 5-325 MG PO TABS
2.0000 | ORAL_TABLET | ORAL | Status: DC | PRN
  Administered 2015-03-14 – 2015-03-15 (×4): 2 via ORAL
  Filled 2015-03-12 (×4): qty 2

## 2015-03-12 MED ORDER — FENTANYL 2.5 MCG/ML BUPIVACAINE 1/10 % EPIDURAL INFUSION (WH - ANES)
14.0000 mL/h | INTRAMUSCULAR | Status: DC | PRN
  Administered 2015-03-12 (×2): 14 mL/h via EPIDURAL
  Filled 2015-03-12 (×2): qty 125

## 2015-03-12 MED ORDER — IBUPROFEN 600 MG PO TABS
600.0000 mg | ORAL_TABLET | Freq: Four times a day (QID) | ORAL | Status: DC
  Administered 2015-03-12 – 2015-03-15 (×11): 600 mg via ORAL
  Filled 2015-03-12 (×11): qty 1

## 2015-03-12 MED ORDER — ZOLPIDEM TARTRATE 5 MG PO TABS: 5.0000 mg | ORAL_TABLET | Freq: Every evening | ORAL | Status: DC | PRN

## 2015-03-12 MED ORDER — MORPHINE SULFATE (PF) 0.5 MG/ML IJ SOLN
INTRAMUSCULAR | Status: DC | PRN
  Administered 2015-03-12: 3 mg via EPIDURAL
  Administered 2015-03-12: 2 mg via INTRAVENOUS

## 2015-03-12 MED ORDER — KETOROLAC TROMETHAMINE 30 MG/ML IJ SOLN
30.0000 mg | Freq: Four times a day (QID) | INTRAMUSCULAR | Status: DC | PRN
  Administered 2015-03-12: 30 mg via INTRAMUSCULAR

## 2015-03-12 MED ORDER — IBUPROFEN 600 MG PO TABS: 600.0000 mg | ORAL_TABLET | Freq: Four times a day (QID) | ORAL | Status: DC | PRN

## 2015-03-12 MED ORDER — DIPHENHYDRAMINE HCL 25 MG PO CAPS: 25.0000 mg | ORAL_CAPSULE | ORAL | Status: DC | PRN

## 2015-03-12 MED ORDER — SCOPOLAMINE 1 MG/3DAYS TD PT72: 1.0000 | MEDICATED_PATCH | Freq: Once | TRANSDERMAL | Status: DC

## 2015-03-12 MED ORDER — LACTATED RINGERS IV SOLN
INTRAVENOUS | Status: DC | PRN
  Administered 2015-03-12 (×2): via INTRAVENOUS

## 2015-03-12 MED ORDER — SODIUM BICARBONATE 8.4 % IV SOLN
INTRAVENOUS | Status: DC | PRN
  Administered 2015-03-12: 3 mL via EPIDURAL
  Administered 2015-03-12: 1 mL via EPIDURAL
  Administered 2015-03-12: 5 mL via EPIDURAL
  Administered 2015-03-12: 3 mL via EPIDURAL
  Administered 2015-03-12: 2 mL via EPIDURAL
  Administered 2015-03-12: 4 mL via EPIDURAL

## 2015-03-12 MED ORDER — DIPHENHYDRAMINE HCL 25 MG PO CAPS: 25.0000 mg | ORAL_CAPSULE | Freq: Four times a day (QID) | ORAL | Status: DC | PRN

## 2015-03-12 MED ORDER — CEFAZOLIN SODIUM-DEXTROSE 2-3 GM-% IV SOLR
INTRAVENOUS | Status: AC
  Filled 2015-03-12: qty 50

## 2015-03-12 MED ORDER — KETOROLAC TROMETHAMINE 30 MG/ML IJ SOLN
INTRAMUSCULAR | Status: AC
  Filled 2015-03-12: qty 1

## 2015-03-12 MED ORDER — HYDROMORPHONE HCL 1 MG/ML IJ SOLN
INTRAMUSCULAR | Status: AC
  Filled 2015-03-12: qty 1

## 2015-03-12 MED ORDER — MEPERIDINE HCL 25 MG/ML IJ SOLN
INTRAMUSCULAR | Status: AC
  Filled 2015-03-12: qty 1

## 2015-03-12 MED ORDER — SENNOSIDES-DOCUSATE SODIUM 8.6-50 MG PO TABS
2.0000 | ORAL_TABLET | ORAL | Status: DC
  Administered 2015-03-12 – 2015-03-14 (×3): 2 via ORAL
  Filled 2015-03-12 (×3): qty 2

## 2015-03-12 MED ORDER — BUPIVACAINE LIPOSOME 1.3 % IJ SUSP
20.0000 mL | Freq: Once | INTRAMUSCULAR | Status: DC
  Filled 2015-03-12: qty 20

## 2015-03-12 MED ORDER — ONDANSETRON HCL 4 MG/2ML IJ SOLN: 4.0000 mg | Freq: Three times a day (TID) | INTRAMUSCULAR | Status: DC | PRN

## 2015-03-12 MED ORDER — NALBUPHINE HCL 10 MG/ML IJ SOLN
5.0000 mg | INTRAMUSCULAR | Status: DC | PRN
Start: 2015-03-12 — End: 2015-03-15

## 2015-03-12 MED ORDER — ACETAMINOPHEN 325 MG PO TABS
650.0000 mg | ORAL_TABLET | ORAL | Status: DC | PRN
  Administered 2015-03-13: 650 mg via ORAL
  Filled 2015-03-12: qty 2

## 2015-03-12 MED ORDER — SIMETHICONE 80 MG PO CHEW
80.0000 mg | CHEWABLE_TABLET | Freq: Three times a day (TID) | ORAL | Status: DC
  Administered 2015-03-13 – 2015-03-15 (×7): 80 mg via ORAL
  Filled 2015-03-12 (×7): qty 1

## 2015-03-12 MED ORDER — SIMETHICONE 80 MG PO CHEW: 80.0000 mg | CHEWABLE_TABLET | ORAL | Status: DC | PRN

## 2015-03-12 MED ORDER — LACTATED RINGERS IV SOLN
INTRAVENOUS | Status: DC
  Administered 2015-03-13: 03:00:00 via INTRAVENOUS

## 2015-03-12 MED ORDER — PHENYLEPHRINE 40 MCG/ML (10ML) SYRINGE FOR IV PUSH (FOR BLOOD PRESSURE SUPPORT)
80.0000 ug | PREFILLED_SYRINGE | INTRAVENOUS | Status: DC | PRN
  Filled 2015-03-12: qty 20

## 2015-03-12 MED ORDER — EPHEDRINE 5 MG/ML INJ: 10.0000 mg | INTRAVENOUS | Status: DC | PRN

## 2015-03-12 MED ORDER — SCOPOLAMINE 1 MG/3DAYS TD PT72
MEDICATED_PATCH | TRANSDERMAL | Status: AC
  Filled 2015-03-12: qty 1

## 2015-03-12 MED ORDER — LIDOCAINE HCL (PF) 1 % IJ SOLN
INTRAMUSCULAR | Status: DC | PRN
  Administered 2015-03-12 (×2): 4 mL

## 2015-03-12 MED ORDER — MORPHINE SULFATE 0.5 MG/ML IJ SOLN
INTRAMUSCULAR | Status: AC
  Filled 2015-03-12: qty 10

## 2015-03-12 MED ORDER — KETOROLAC TROMETHAMINE 30 MG/ML IJ SOLN: 30.0000 mg | Freq: Four times a day (QID) | INTRAMUSCULAR | Status: DC | PRN

## 2015-03-12 MED ORDER — BUPIVACAINE LIPOSOME 1.3 % IJ SUSP
INTRAMUSCULAR | Status: DC | PRN
  Administered 2015-03-12: 20 mL

## 2015-03-12 MED ORDER — OXYTOCIN 10 UNIT/ML IJ SOLN
INTRAMUSCULAR | Status: AC
  Filled 2015-03-12: qty 4

## 2015-03-12 MED ORDER — WITCH HAZEL-GLYCERIN EX PADS
1.0000 | MEDICATED_PAD | CUTANEOUS | Status: DC | PRN
Start: 2015-03-12 — End: 2015-03-15

## 2015-03-12 MED ORDER — PROMETHAZINE HCL 25 MG/ML IJ SOLN: 6.2500 mg | INTRAMUSCULAR | Status: DC | PRN

## 2015-03-12 MED ORDER — LACTATED RINGERS IV SOLN
500.0000 mL | Freq: Once | INTRAVENOUS | Status: AC
  Administered 2015-03-12: 500 mL via INTRAVENOUS

## 2015-03-12 MED ORDER — SCOPOLAMINE 1 MG/3DAYS TD PT72
MEDICATED_PATCH | TRANSDERMAL | Status: DC | PRN
  Administered 2015-03-12: 1 via TRANSDERMAL

## 2015-03-12 MED ORDER — PHENYLEPHRINE 40 MCG/ML (10ML) SYRINGE FOR IV PUSH (FOR BLOOD PRESSURE SUPPORT): 80.0000 ug | PREFILLED_SYRINGE | INTRAVENOUS | Status: DC | PRN

## 2015-03-12 MED ORDER — OXYTOCIN 40 UNITS IN LACTATED RINGERS INFUSION - SIMPLE MED: 62.5000 mL/h | INTRAVENOUS | Status: AC

## 2015-03-12 MED ORDER — OXYTOCIN 10 UNIT/ML IJ SOLN
40.0000 [IU] | INTRAVENOUS | Status: DC | PRN
  Administered 2015-03-12: 40 [IU] via INTRAVENOUS

## 2015-03-12 SURGICAL SUPPLY — 39 items
APL SKNCLS STERI-STRIP NONHPOA (GAUZE/BANDAGES/DRESSINGS) ×1
BENZOIN TINCTURE PRP APPL 2/3 (GAUZE/BANDAGES/DRESSINGS) ×3 IMPLANT
CATH ROBINSON RED A/P 16FR (CATHETERS) IMPLANT
CLAMP CORD UMBIL (MISCELLANEOUS) IMPLANT
CLOSURE WOUND 1/2 X4 (GAUZE/BANDAGES/DRESSINGS) ×2
CLOTH BEACON ORANGE TIMEOUT ST (SAFETY) ×3 IMPLANT
DRAPE SHEET LG 3/4 BI-LAMINATE (DRAPES) IMPLANT
DRSG OPSITE POSTOP 4X10 (GAUZE/BANDAGES/DRESSINGS) ×3 IMPLANT
DURAPREP 26ML APPLICATOR (WOUND CARE) ×3 IMPLANT
ELECT REM PT RETURN 9FT ADLT (ELECTROSURGICAL) ×3
ELECTRODE REM PT RTRN 9FT ADLT (ELECTROSURGICAL) ×1 IMPLANT
EXTRACTOR VACUUM M CUP 4 TUBE (SUCTIONS) IMPLANT
EXTRACTOR VACUUM M CUP 4' TUBE (SUCTIONS)
GLOVE BIOGEL PI IND STRL 7.5 (GLOVE) ×2 IMPLANT
GLOVE BIOGEL PI INDICATOR 7.5 (GLOVE) ×4
GLOVE ECLIPSE 7.5 STRL STRAW (GLOVE) ×3 IMPLANT
GOWN STRL REUS W/TWL LRG LVL3 (GOWN DISPOSABLE) ×9 IMPLANT
KIT ABG SYR 3ML LUER SLIP (SYRINGE) IMPLANT
NDL HYPO 25X5/8 SAFETYGLIDE (NEEDLE) IMPLANT
NEEDLE HYPO 22GX1.5 SAFETY (NEEDLE) ×3 IMPLANT
NEEDLE HYPO 25X5/8 SAFETYGLIDE (NEEDLE) IMPLANT
NS IRRIG 1000ML POUR BTL (IV SOLUTION) ×3 IMPLANT
PACK C SECTION WH (CUSTOM PROCEDURE TRAY) ×3 IMPLANT
PAD OB MATERNITY 4.3X12.25 (PERSONAL CARE ITEMS) ×3 IMPLANT
RTRCTR C-SECT PINK 25CM LRG (MISCELLANEOUS) IMPLANT
STRIP CLOSURE SKIN 1/2X4 (GAUZE/BANDAGES/DRESSINGS) ×3 IMPLANT
SUT MNCRL 0 VIOLET CTX 36 (SUTURE) IMPLANT
SUT MONOCRYL 0 CTX 36 (SUTURE)
SUT PLAIN 2 0 (SUTURE) ×3
SUT PLAIN ABS 2-0 CT1 27XMFL (SUTURE) IMPLANT
SUT VIC AB 0 CTX 36 (SUTURE) ×9
SUT VIC AB 0 CTX36XBRD ANBCTRL (SUTURE) ×3 IMPLANT
SUT VIC AB 2-0 CT1 (SUTURE) ×2 IMPLANT
SUT VIC AB 2-0 CT1 27 (SUTURE) ×3
SUT VIC AB 2-0 CT1 TAPERPNT 27 (SUTURE) ×1 IMPLANT
SUT VIC AB 4-0 KS 27 (SUTURE) ×5 IMPLANT
SYR 30ML LL (SYRINGE) ×3 IMPLANT
TOWEL OR 17X24 6PK STRL BLUE (TOWEL DISPOSABLE) ×3 IMPLANT
TRAY FOLEY CATH SILVER 14FR (SET/KITS/TRAYS/PACK) ×3 IMPLANT

## 2015-03-12 NOTE — Progress Notes (Signed)
Labor Progress Note  S: comfortable  O:  BP 123/70 mmHg  Pulse 86  Temp(Src) 99.2 F (37.3 C) (Oral)  Resp 20  Ht 5\' 5"  (1.651 m)  Wt 214 lb (97.07 kg)  BMI 35.61 kg/m2  SpO2 98%  LMP 05/30/2014 Cat I CVE:  0930 9/100/0 1240 9/100/0 LOP on exam, rotation attempted 1400 10/100/+1 OP on exam  A&P: 39 y.o. [id number] 7568w6d IOL due to PPROM # labor: continue pushing, pelvic exam reveals adequate pelvis and progression although slow from AL to 10cm and to station of +1 - continue pushing, reevaluate in 45minutes  Endi Lagman ROCIO, MD 2:14 PM

## 2015-03-12 NOTE — Op Note (Addendum)
Jane Sherman PROCEDURE DATE: 03/12/2015  PREOPERATIVE DIAGNOSES: Intrauterine pregnancy at 4865w6d weeks gestation; arrest of descent  POSTOPERATIVE DIAGNOSES: The same, deep arrest  PROCEDURE: Primary Low Transverse Cesarean Section  SURGEON:  Dr. Candelaria CelesteJacob Stinson  ASSISTANT:  Dr. Fredirick LatheKristy Acosta  ANESTHESIOLOGIST: Dr. Leilani AbleFranklin Hatchett  INDICATIONS: Jane Sherman is a 39 y.o. [id number] at 4165w6d here for cesarean section secondary to the indications listed under preoperative diagnoses; please see preoperative note for further details.  The risks of cesarean section were discussed with the patient including but were not limited to: bleeding which may require transfusion or reoperation; infection which may require antibiotics; injury to bowel, bladder, ureters or other surrounding organs; injury to the fetus; need for additional procedures including hysterectomy in the event of a life-threatening hemorrhage; placental abnormalities wth subsequent pregnancies, incisional problems, thromboembolic phenomenon and other postoperative/anesthesia complications.   The patient concurred with the proposed plan, giving informed written consent for the procedure.    FINDINGS:    Viable female infant, vertex, direct occiput posterior position.  APGAR: 9, 9.  Weight 9 pounds 13 ounces.  Clear amniotic fluid.  Intact placenta, three vessel cord.  Normal uterus, fallopian tubes and ovaries bilaterally.  ANESTHESIA: Epidural INTRAVENOUS FLUIDS: 3300 ml ESTIMATED BLOOD LOSS: 800 ml URINE OUTPUT:  800 ml SPECIMENS: Placenta sent to L&D COMPLICATIONS: deep arrest  PROCEDURE IN DETAIL:  The patient preoperatively received intravenous antibiotics and had sequential compression devices applied to her lower extremities.  She was then taken to the operating room where the epidural anesthesia was dosed up to surgical level and was found to be adequate. She was then placed in a dorsal supine position with a  leftward tilt, and prepped and draped in a sterile manner.  A foley catheter was placed into her bladder and attached to constant gravity.  After an adequate timeout was performed, a Pfannenstiel skin incision was made with scalpel and carried through to the underlying layer of fascia. The fascia was incised in the midline, and this incision was extended bilaterally using the Mayo scissors.  Kocher clamps were applied to the superior aspect of the fascial incision and the underlying rectus muscles were dissected off bluntly. The rectus muscles were separated in the midline bluntly and the peritoneum was entered bluntly. Attention was turned to the lower uterine segment where a low transverse hysterotomy was made with a scalpel and extended bilaterally bluntly.  The infant was successfully delivered with difficulty from deep arrest after red rubber inserted and assistance from vagina, the cord was clamped and cut and the infant was handed over to awaiting neonatology team. Uterine massage was then administered, and the placenta delivered intact with a three-vessel cord. The uterus was then cleared of clot and debris.  The hysterotomy was closed with 0 Vicryl in a running locked fashion, and an imbricating layer was also placed with 0 Vicryl. The pelvis was cleared of all clot and debris. Hemostasis was confirmed on all surfaces.  The peritoneum and the muscles were reapproximated using 0 Vicryl interrupted stitches. The fascia was then closed using 0 Vicryl in a running fashion.  The subcutaneous layer was irrigated, then reapproximated with 2-0 plain gut interrupted stitches, and 30 ml of 0.5% Marcaine and 20mL 1.3% Exparel were injected subcutaneously around the incision.  The skin was closed with a 4-0 Vicryl subcuticular stitch. The patient tolerated the procedure well. Sponge, lap, instrument and needle counts were correct x 2.  She was taken to the recovery room in stable  condition.   Perry Mount, MD   OB Fellow Faculty Practice, [facility]   Attestation of Attending Supervision of Obstetric Fellow During Surgery: Surgery was performed by the Obstetric Fellow under my supervision and collaboration.  I have reviewed the Obstetric Fellow's operative report, and I agree with the documentation.  I was present and scrubbed for the entire procedure.    Candelaria Celeste, DO Attending Physician Faculty Practice, [facility] [place]

## 2015-03-12 NOTE — Progress Notes (Signed)
Jane Sherman is a 39 y.o. [phone] at 6595w6d admitted for induction of labor due to PROM  Subjective: Feeling mild contractions, tolerating well, increased fetal movement, no other complaints  Objective: BP 107/69 mmHg  Pulse 85  Temp(Src) 98.2 F (36.8 C) (Oral)  Resp 20  Ht 5\' 5"  (1.651 m)  Wt 97.07 kg (214 lb)  BMI 35.61 kg/m2  SpO2 100%  LMP 05/30/2014      FHT:  FHR: 165 bpm, variability: moderate,  accelerations:  Present,  decelerations:  Present shallow variables UC:   regular, every 6 minutes SVE:   Dilation: 3 Effacement (%): 50 Station: -3 Exam by:: Jane BusingKatherine G Jones RN  Labs: Lab Results  Component Value Date   WBC 10.7* 03/11/2015   HGB 12.9 03/11/2015   HCT 38.4 03/11/2015   MCV 95.0 03/11/2015   PLT 219 03/11/2015    Assessment / Plan: Augmentation of labor, progressing well  Labor: Cervix from FT to 3cm w/ 1 dose cytotec, will start pitocin now Preeclampsia:  n/a Fetal Wellbeing:  Category II Pain Control:  Labor support without medications I/D:  n/a Anticipated MOD:  NSVD  Jane Sherman, Jane Sherman 03/12/2015, 3:41 AM

## 2015-03-12 NOTE — Progress Notes (Signed)
Patient ID: Jane Sherman, female   DOB: 07/25/1976, 38 y.o.   MRN: [id number]  Patient has been complete for >2 hours and has been pushing with minimal improvement of fetal station.  Baby is felt to be occiput posterior and attempts at rotating the baby have not been successful.  The risks of cesarean section discussed with the patient included but were not limited to: bleeding which may require transfusion or reoperation; infection which may require antibiotics; injury to bowel, bladder, ureters or other surrounding organs; injury to the fetus; need for additional procedures including hysterectomy in the event of a life-threatening hemorrhage; placental abnormalities wth subsequent pregnancies, incisional problems, thromboembolic phenomenon and other postoperative/anesthesia complications. The patient concurred with the proposed plan, giving informed written consent for the procedure.   Patient has been NPO since last night she will remain NPO for procedure. Anesthesia and OR aware.  Preoperative prophylactic Ancef ordered on call to the OR.  To OR when ready.  Jacob J Stinson, DO 03/12/2015 3:57 PM   

## 2015-03-12 NOTE — Transfer of Care (Signed)
Immediate Anesthesia Transfer of Care Note  Patient: Jane Sherman  Procedure(s) Performed: Procedure(s): CESAREAN SECTION (N/A)  Patient Location: PACU  Anesthesia Type:Epidural  Level of Consciousness: awake, alert , oriented and patient cooperative  Airway & Oxygen Therapy: Patient Spontanous Breathing  Post-op Assessment: Report given to RN and Post -op Vital signs reviewed and stable  Post vital signs: Reviewed and stable  Last Vitals:  Filed Vitals:   03/12/15 1554  BP:   Pulse:   Temp:   Resp: 20    Complications: No apparent anesthesia complications

## 2015-03-12 NOTE — H&P (View-Only) (Signed)
Patient ID: Danella Sensingngrid Trostle, female   DOB: 1976-08-27, 39 y.o.   MRN: [id number]  Patient has been complete for >2 hours and has been pushing with minimal improvement of fetal station.  Baby is felt to be occiput posterior and attempts at rotating the baby have not been successful.  The risks of cesarean section discussed with the patient included but were not limited to: bleeding which may require transfusion or reoperation; infection which may require antibiotics; injury to bowel, bladder, ureters or other surrounding organs; injury to the fetus; need for additional procedures including hysterectomy in the event of a life-threatening hemorrhage; placental abnormalities wth subsequent pregnancies, incisional problems, thromboembolic phenomenon and other postoperative/anesthesia complications. The patient concurred with the proposed plan, giving informed written consent for the procedure.   Patient has been NPO since last night she will remain NPO for procedure. Anesthesia and OR aware.  Preoperative prophylactic Ancef ordered on call to the OR.  To OR when ready.  Levie HeritageJacob J Stinson, DO 03/12/2015 3:57 PM

## 2015-03-12 NOTE — Anesthesia Procedure Notes (Signed)
Epidural Patient location during procedure: OB Start time: 03/12/2015 6:47 AM  Staffing Anesthesiologist: Mal AmabileFOSTER, Sabrea Sankey Performed by: anesthesiologist   Preanesthetic Checklist Completed: patient identified, site marked, surgical consent, pre-op evaluation, timeout performed, IV checked, risks and benefits discussed and monitors and equipment checked  Epidural Patient position: sitting Prep: site prepped and draped and DuraPrep Patient monitoring: continuous pulse ox and blood pressure Approach: midline Location: L4-L5 Injection technique: LOR air  Needle:  Needle type: Tuohy  Needle gauge: 17 G Needle length: 9 cm and 9 Needle insertion depth: 5 cm cm Catheter type: closed end flexible Catheter size: 19 Gauge Catheter at skin depth: 10 cm Test dose: negative and Other  Assessment Events: blood not aspirated, injection not painful, no injection resistance, negative IV test and no paresthesia  Additional Notes Patient identified. Risks and benefits discussed including failed block, incomplete  Pain control, post dural puncture headache, nerve damage, paralysis, blood pressure Changes, nausea, vomiting, reactions to medications-both toxic and allergic and post Partum back pain. All questions were answered. Patient expressed understanding and wished to proceed. Sterile technique was used throughout procedure. Epidural site was Dressed with sterile barrier dressing. No paresthesias, signs of intravascular injection Or signs of intrathecal spread were encountered.  Patient was more comfortable after the epidural was dosed. Please see RN's note for documentation of vital signs and FHR which are stable.

## 2015-03-12 NOTE — Progress Notes (Signed)
Dr Adrian BlackwaterStinson explained risks and benefits of cesarean section for arrest of descent.  Pt verbalized understanding.

## 2015-03-12 NOTE — Progress Notes (Signed)
Labor Progress Note  S: comfortable  O:  BP 99/58 mmHg  Pulse 100  Temp(Src) 99.2 F (37.3 C) (Oral)  Resp 20  Ht 5\' 5"  (1.651 m)  Wt 214 lb (97.07 kg)  BMI 35.61 kg/m2  SpO2 98%  LMP 05/30/2014 Cat I CVE:  0930 9/100/0 1240 9/100/0 LOP on exam, rotation attempted 1400 10/100/+1 OP on exam 1500 10/100/+1, no progression with pushing  A&P: 39 y.o. [id number] 411w6d IOL due to PPROM # labor: pelvic exam adequate but no progression with pushing, not candidate for vacuum assisted delivery 2/2 suspected macrosomia and high station - trial of all-fours x8330minutes, reevaluate.  Discussed guarded prognosis.  Perry MountACOSTA,Jane Marshman ROCIO, MD 3:03 PM

## 2015-03-12 NOTE — Progress Notes (Signed)
Dr Loreta AveAcosta explained risks and benefits of cesarean section for arrest of descent. Pt verbalized understanding and signed consent.

## 2015-03-12 NOTE — Interval H&P Note (Signed)
History and Physical Interval Note:  03/12/2015 4:24 PM  Jane Sherman    STINSON, JACOB JEHIEL

## 2015-03-12 NOTE — Interval H&P Note (Signed)
History and Physical Interval Note:  03/12/2015 4:26 PM  Patient admitted for PROM, has progressed to complete.  She has remained complete for more than 2 hours with little progression in patient station.  Plan is to proceed with cesarean delivery.   STINSON, Jane Sherman

## 2015-03-12 NOTE — Anesthesia Postprocedure Evaluation (Signed)
Anesthesia Post Note  Patient: Jane SensingIngrid Sherman  Procedure(s) Performed: Procedure(s) (LRB): CESAREAN SECTION (N/A)  Anesthesia type: Epidural  Patient location: PACU  Post pain: Pain level controlled  Post assessment: Post-op Vital signs reviewed  Last Vitals:  Filed Vitals:   03/12/15 1830  BP: 126/73  Pulse: 88  Temp:   Resp: 22    Post vital signs: Reviewed  Level of consciousness: awake  Complications: No apparent anesthesia complications

## 2015-03-12 NOTE — Progress Notes (Signed)
Labor Progress Note  S: comfortable  O:  BP 119/70 mmHg  Pulse 100  Temp(Src) 99.2 F (37.3 C) (Oral)  Resp 20  Ht 5\' 5"  (1.651 m)  Wt 214 lb (97.07 kg)  BMI 35.61 kg/m2  SpO2 98%  LMP 05/30/2014 Cat I CVE:  0930 9/100/0 1240 9/100/0 LOP on exam, rotation attempted 1400 10/100/+1 OP on exam 1500 10/100/+1, no progression with pushing 1538 10/100/+1 no progression  A&P: 39 y.o. [id number] 10664w6d IOL due to PPROM # labor: pelvic exam adequate but no progression with pushing, not candidate for vacuum assisted delivery 2/2 suspected macrosomia and high station - consented for cesarean section for failure to descent, agreeable with plan  The risks of cesarean section discussed with the patient included but were not limited to: bleeding which may require transfusion or reoperation; infection which may require antibiotics; injury to bowel, bladder, ureters or other surrounding organs; injury to the fetus; need for additional procedures including hysterectomy in the event of a life-threatening hemorrhage; placental abnormalities wth subsequent pregnancies, incisional problems, thromboembolic phenomenon and other postoperative/anesthesia complications. The patient concurred with the proposed plan, giving informed written consent for the procedure.   Patient has been NPO since last night, had liquids 20 minutes ago she will remain NPO for procedure. Anesthesia and OR aware. Preoperative prophylactic antibiotics and SCDs ordered on call to the OR.  To OR when ready.     Perry MountACOSTA,Debora Stockdale ROCIO, MD 3:38 PM

## 2015-03-12 NOTE — Progress Notes (Signed)
Danella Sensingngrid Salomon is a 39 y.o. [phone] at 8633w6d by ultrasound admitted for induction of labor due to PROM.  Subjective: Patient reports that she is doing well.  Denies any concerns at this time  Objective: BP 99/48 mmHg  Pulse 84  Temp(Src) 98 F (36.7 C) (Oral)  Resp 18  Ht 5\' 5"  (1.651 m)  Wt 214 lb (97.07 kg)  BMI 35.61 kg/m2  SpO2 98%  LMP 05/30/2014     Gen: awake, lying on side, NAD  FHT:  FHR: 150 bpm, variability: moderate,  accelerations:  Present,  decelerations:  Present early/variable UC:   regular, every 1-2 minutes SVE:   Dilation: 8.5 Effacement (%): 100 Station: -1 Exam by:: L Lamon RN  Labs: Lab Results  Component Value Date   WBC 10.7* 03/11/2015   HGB 12.9 03/11/2015   HCT 38.4 03/11/2015   MCV 95.0 03/11/2015   PLT 219 03/11/2015    Assessment / Plan: Induction of labor due to PROM,  progressing well on pitocin  Labor: Progressing normally Fetal Wellbeing:  Category I Pain Control:  Epidural I/D:  Valtrex, HSV+ Anticipated MOD:  NSVD  Raliegh IpGottschalk, Kaisa Wofford M, DO 03/12/2015, 10:08 AM

## 2015-03-12 NOTE — Consult Note (Signed)
Neonatology Note:   Attendance at C-section:    I was asked by Dr. Adrian BlackwaterStinson to attend this primary C/S at term [facility]. 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 ROM 34 hours prior to delivery, fluid clear. Infant vigorous with good spontaneous cry and tone. Needed only minimal bulb suctioning. Ap 9/9. Lungs clear to ausc in DR. To CN to care of Pediatrician.   Doretha Souhristie C. Ishmail Mcmanamon, MD

## 2015-03-12 NOTE — Anesthesia Preprocedure Evaluation (Addendum)
Anesthesia Evaluation  Patient identified by MRN, date of birth, ID band Patient awake    Reviewed: Allergy & Precautions, NPO status , Patient's Chart, lab work & pertinent test results  Airway Mallampati: III  TM Distance: >3 FB Neck ROM: Full    Dental no notable dental hx. (+) Teeth Intact   Pulmonary neg pulmonary ROS, former smoker,  breath sounds clear to auscultation  Pulmonary exam normal       Cardiovascular negative cardio ROS  Rhythm:Regular Rate:Normal     Neuro/Psych negative neurological ROS  negative psych ROS   GI/Hepatic negative GI ROS, Neg liver ROS,   Endo/Other  diabetesObesity  Renal/GU negative Renal ROS  negative genitourinary   Musculoskeletal Scoliosis   Abdominal (+) + obese,   Peds  Hematology negative hematology ROS (+)   Anesthesia Other Findings   Reproductive/Obstetrics (+) Pregnancy HSV                           Anesthesia Physical Anesthesia Plan  ASA: II  Anesthesia Plan: Epidural   Post-op Pain Management:    Induction:   Airway Management Planned: Natural Airway  Additional Equipment:   Intra-op Plan:   Post-operative Plan:   Informed Consent: I have reviewed the patients History and Physical, chart, labs and discussed the procedure including the risks, benefits and alternatives for the proposed anesthesia with the patient or authorized representative who has indicated his/her understanding and acceptance.   Dental advisory given  Plan Discussed with: Anesthesiologist  Anesthesia Plan Comments: (For C/S with above (laced epidural that has been working well)       Anesthesia Quick Evaluation

## 2015-03-13 ENCOUNTER — Encounter (HOSPITAL_COMMUNITY): Payer: Self-pay | Admitting: Family Medicine

## 2015-03-13 LAB — CBC
HCT: 28.2 % — ABNORMAL LOW (ref 36.0–46.0)
Hemoglobin: 9.7 g/dL — ABNORMAL LOW (ref 12.0–15.0)
MCH: 32.7 pg (ref 26.0–34.0)
MCHC: 34.4 g/dL (ref 30.0–36.0)
MCV: 94.9 fL (ref 78.0–100.0)
Platelets: 164 10*3/uL (ref 150–400)
RBC: 2.97 MIL/uL — ABNORMAL LOW (ref 3.87–5.11)
RDW: 14.8 % (ref 11.5–15.5)
WBC: 15.1 10*3/uL — ABNORMAL HIGH (ref 4.0–10.5)

## 2015-03-13 NOTE — Progress Notes (Signed)
Post Partum Day 1 Subjective:  Jane Sherman is a 39 y.o. [id number] 9757w6d s/p pLTCS for arrest of descent.  No acute events overnight.  Pt denies problems with ambulating, voiding or po intake.  She denies nausea or vomiting.  Pain is well controlled.  She has had flatus. She has had bowel movement.  Lochia Minimal.  Plan for birth control is Depo-Provera.  Method of Feeding: Bottle  Objective: Blood pressure 96/67, pulse 88, temperature 98 F (36.7 C), temperature source Oral, resp. rate 20, height 5\' 5"  (1.651 m), weight 97.07 kg (214 lb), last menstrual period 05/30/2014, SpO2 98 %, not currently breastfeeding.  Physical Exam:  General: alert, cooperative and no distress Lochia:normal flow Chest: CTAB, normal work of breathing Heart: RRR no m/r/g, normal S1/S2 Abdomen: soft, nontender Uterine Fundus: firm, midline DVT Evaluation: No evidence of DVT seen on physical exam. Extremities: 1+ edema   Recent Labs  03/11/15 2230  HGB 12.9  HCT 38.4    Assessment/Plan:  ASSESSMENT: Jane Sherman is a 39 y.o. [id number] 7657w6d s/p pLTCS due to arrest of labor who is stable and doing well  Plan for discharge tomorrow or the next day  [facility]   [facility]   LOS: 2 days   LEFTWICH-KIRBY, Duane Trias, CNM 8:04 AM

## 2015-03-13 NOTE — Anesthesia Postprocedure Evaluation (Signed)
  Anesthesia Post-op Note  Patient: Jane Sherman  Procedure(s) Performed: Procedure(s): CESAREAN SECTION (N/A)  Patient Location: Mother/Baby  Anesthesia Type:Epidural  Level of Consciousness: awake, alert , oriented and patient cooperative  Airway and Oxygen Therapy: Patient Spontanous Breathing  Post-op Pain: none  Post-op Assessment: Post-op Vital signs reviewed, Patient's Cardiovascular Status Stable, Respiratory Function Stable, Patent Airway, No headache, No backache, No residual numbness and No residual motor weakness  Post-op Vital Signs: Reviewed and stable  Last Vitals:  Filed Vitals:   03/13/15 0547  BP: 96/67  Pulse: 88  Temp: 36.7 C  Resp: 20    Complications: No apparent anesthesia complications

## 2015-03-13 NOTE — Addendum Note (Signed)
Addendum  created 03/13/15 [id number] by Yolonda KidaAlison L Marland Reine, CRNA   Modules edited: Notes Section   Notes Section:  File: [id number]

## 2015-03-14 LAB — BIRTH TISSUE RECOVERY COLLECTION (PLACENTA DONATION)

## 2015-03-14 MED ORDER — IBUPROFEN 600 MG PO TABS: 600.0000 mg | ORAL_TABLET | Freq: Four times a day (QID) | ORAL | Status: DC

## 2015-03-14 MED ORDER — OXYCODONE-ACETAMINOPHEN 5-325 MG PO TABS: 1.0000 | ORAL_TABLET | ORAL | Status: DC | PRN

## 2015-03-14 NOTE — Progress Notes (Signed)
Ur chart review completed.  

## 2015-03-14 NOTE — Discharge Summary (Signed)
Obstetric Discharge Summary Reason for Admission: rupture of membranes Prenatal Procedures: ultrasound Intrapartum Procedures: cesarean: low cervical, transverse due to arrest of descent Postpartum Procedures: none Complications-Operative and Postpartum: none  PROCEDURE DATE: 03/12/2015  PREOPERATIVE DIAGNOSES: Intrauterine pregnancy at 4660w6d weeks gestation; arrest of descent  POSTOPERATIVE DIAGNOSES: The same, deep arrest  PROCEDURE: Primary Low Transverse Cesarean Section  SURGEON: Dr. Candelaria CelesteJacob Stinson  ASSISTANT: Dr. Fredirick LatheKristy Acosta  ANESTHESIOLOGIST: Dr. Leilani AbleFranklin Hatchett  INDICATIONS: Jane Sherman is a 39 y.o. [id number] at 4560w6d here for cesarean section secondary to the indications listed under preoperative diagnoses; please see preoperative note for further details. The risks of cesarean section were discussed with the patient including but were not limited to: bleeding which may require transfusion or reoperation; infection which may require antibiotics; injury to bowel, bladder, ureters or other surrounding organs; injury to the fetus; need for additional procedures including hysterectomy in the event of a life-threatening hemorrhage; placental abnormalities wth subsequent pregnancies, incisional problems, thromboembolic phenomenon and other postoperative/anesthesia complications. The patient concurred with the proposed plan, giving informed written consent for the procedure.   FINDINGS:   Viable female infant, vertex, direct occiput posterior position. APGAR: 9, 9. Weight 9 pounds 13 ounces. Clear amniotic fluid. Intact placenta, three vessel cord. Normal uterus, fallopian tubes and ovaries bilaterally.  ANESTHESIA: Epidural INTRAVENOUS FLUIDS: 3300 ml ESTIMATED BLOOD LOSS: 800 ml URINE OUTPUT: 800 ml SPECIMENS: Placenta sent to L&D COMPLICATIONS: deep arrest  PROCEDURE IN DETAIL: The patient preoperatively received intravenous antibiotics and had  sequential compression devices applied to her lower extremities. She was then taken to the operating room where the epidural anesthesia was dosed up to surgical level and was found to be adequate. She was then placed in a dorsal supine position with a leftward tilt, and prepped and draped in a sterile manner. A foley catheter was placed into her bladder and attached to constant gravity. After an adequate timeout was performed, a Pfannenstiel skin incision was made with scalpel and carried through to the underlying layer of fascia. The fascia was incised in the midline, and this incision was extended bilaterally using the Mayo scissors. Kocher clamps were applied to the superior aspect of the fascial incision and the underlying rectus muscles were dissected off bluntly. The rectus muscles were separated in the midline bluntly and the peritoneum was entered bluntly. Attention was turned to the lower uterine segment where a low transverse hysterotomy was made with a scalpel and extended bilaterally bluntly. The infant was successfully delivered with difficulty from deep arrest after red rubber inserted and assistance from vagina, the cord was clamped and cut and the infant was handed over to awaiting neonatology team. Uterine massage was then administered, and the placenta delivered intact with a three-vessel cord. The uterus was then cleared of clot and debris. The hysterotomy was closed with 0 Vicryl in a running locked fashion, and an imbricating layer was also placed with 0 Vicryl. The pelvis was cleared of all clot and debris. Hemostasis was confirmed on all surfaces. The peritoneum and the muscles were reapproximated using 0 Vicryl interrupted stitches. The fascia was then closed using 0 Vicryl in a running fashion. The subcutaneous layer was irrigated, then reapproximated with 2-0 plain gut interrupted stitches, and 30 ml of 0.5% Marcaine and 20mL 1.3% Exparel were injected subcutaneously around the  incision. The skin was closed with a 4-0 Vicryl subcuticular stitch. The patient tolerated the procedure well. Sponge, lap, instrument and needle counts were correct x 2. She was taken  to the recovery room in stable condition.    Hospital Course:  Active Problems:   PROM (premature rupture of membranes)   Jane Sherman is a 39 y.o. [id number] s/p pLTCS.  Patient was admitted 03/11/15 for SROM.  She has postpartum course that was uncomplicated including no problems with ambulating, PO intake, urination, pain, or bleeding. The pt feels ready to go home and  will be discharged with outpatient follow-up.   Today: No acute events overnight.  Pt denies problems with ambulating, voiding or po intake.  She denies nausea or vomiting.  Pain is well controlled.  She has had flatus. She has had bowel movement.  Lochia Minimal.  Plan for birth control is  Depo-Provera.  Method of Feeding: Bottle  Physical Exam:  General: alert, cooperative and no distress Lochia: appropriate Uterine Fundus: firm Incision: healing well, no significant erythema DVT Evaluation: No evidence of DVT seen on physical exam. No cords or calf tenderness. No significant calf/ankle edema.  H/H: Lab Results  Component Value Date/Time   HGB 9.7* 03/13/2015 08:05 AM   HCT 28.2* 03/13/2015 08:05 AM    Jane Sherman is a 39 y.o. [id number] s/p pLTCS due to arrest of descent who is stable and doing well; ready to be discharged and follow up in one week in clinic.  Discharge Diagnoses: Term Pregnancy-delivered  Discharge Information: Date: 03/14/2015 Activity: pelvic rest Diet: routine  Medications: PNV, Ibuprofen and Percocet Breast feeding:  No: Bottle Condition: stable Instructions: refer to handout Discharge to: home     Jane Sherman, Med Student 03/14/2015,7:50 AM

## 2015-03-14 NOTE — Progress Notes (Signed)
**  Interval Note**  Baby unable to be discharged today.  Mother's discharged cancelled.  Plan for discharge tomorrow.  Dickie Cloe M. Nadine CountsGottschalk, DO PGY-1, [facility]

## 2015-03-15 MED ORDER — IBUPROFEN 600 MG PO TABS: 600.0000 mg | ORAL_TABLET | Freq: Four times a day (QID) | ORAL | Status: DC

## 2015-03-15 MED ORDER — DOCUSATE SODIUM 100 MG PO CAPS: 100.0000 mg | ORAL_CAPSULE | Freq: Two times a day (BID) | ORAL | Status: DC | PRN

## 2015-03-15 MED ORDER — OXYCODONE-ACETAMINOPHEN 5-325 MG PO TABS: 1.0000 | ORAL_TABLET | Freq: Four times a day (QID) | ORAL | Status: DC | PRN

## 2015-03-15 NOTE — Discharge Summary (Signed)
Obstetric Discharge Summary Reason for Admission: rupture of membranes Prenatal Procedures: ultrasound Intrapartum Procedures: cesarean: low cervical, transverse due to arrest of descent Postpartum Procedures: none Complications-Operative and Postpartum: none  PROCEDURE DATE: 03/12/2015  PREOPERATIVE DIAGNOSES: Intrauterine pregnancy at 4658w6d weeks gestation; arrest of descent  POSTOPERATIVE DIAGNOSES: The same, deep arrest  PROCEDURE: Primary Low Transverse Cesarean Section  SURGEON: Dr. Candelaria CelesteJacob Stinson  ASSISTANT: Dr. Fredirick LatheKristy Acosta  ANESTHESIOLOGIST: Dr. Leilani AbleFranklin Hatchett  INDICATIONS: Jane Sherman is a 39 y.o. [id number] at 7358w6d here for cesarean section secondary to the indications listed under preoperative diagnoses; please see preoperative note for further details. The risks of cesarean section were discussed with the patient including but were not limited to: bleeding which may require transfusion or reoperation; infection which may require antibiotics; injury to bowel, bladder, ureters or other surrounding organs; injury to the fetus; need for additional procedures including hysterectomy in the event of a life-threatening hemorrhage; placental abnormalities wth subsequent pregnancies, incisional problems, thromboembolic phenomenon and other postoperative/anesthesia complications. The patient concurred with the proposed plan, giving informed written consent for the procedure.   FINDINGS:   Viable female infant, vertex, direct occiput posterior position. APGAR: 9, 9. Weight 9 pounds 13 ounces. Clear amniotic fluid. Intact placenta, three vessel cord. Normal uterus, fallopian tubes and ovaries bilaterally.  ANESTHESIA: Epidural INTRAVENOUS FLUIDS: 3300 ml ESTIMATED BLOOD LOSS: 800 ml URINE OUTPUT: 800 ml SPECIMENS: Placenta sent to L&D COMPLICATIONS: deep arrest  PROCEDURE IN DETAIL: The patient preoperatively received intravenous antibiotics and had  sequential compression devices applied to her lower extremities. She was then taken to the operating room where the epidural anesthesia was dosed up to surgical level and was found to be adequate. She was then placed in a dorsal supine position with a leftward tilt, and prepped and draped in a sterile manner. A foley catheter was placed into her bladder and attached to constant gravity. After an adequate timeout was performed, a Pfannenstiel skin incision was made with scalpel and carried through to the underlying layer of fascia. The fascia was incised in the midline, and this incision was extended bilaterally using the Mayo scissors. Kocher clamps were applied to the superior aspect of the fascial incision and the underlying rectus muscles were dissected off bluntly. The rectus muscles were separated in the midline bluntly and the peritoneum was entered bluntly. Attention was turned to the lower uterine segment where a low transverse hysterotomy was made with a scalpel and extended bilaterally bluntly. The infant was successfully delivered with difficulty from deep arrest after red rubber inserted and assistance from vagina, the cord was clamped and cut and the infant was handed over to awaiting neonatology team. Uterine massage was then administered, and the placenta delivered intact with a three-vessel cord. The uterus was then cleared of clot and debris. The hysterotomy was closed with 0 Vicryl in a running locked fashion, and an imbricating layer was also placed with 0 Vicryl. The pelvis was cleared of all clot and debris. Hemostasis was confirmed on all surfaces. The peritoneum and the muscles were reapproximated using 0 Vicryl interrupted stitches. The fascia was then closed using 0 Vicryl in a running fashion. The subcutaneous layer was irrigated, then reapproximated with 2-0 plain gut interrupted stitches, and 30 ml of 0.5% Marcaine and 20mL 1.3% Exparel were injected subcutaneously around the  incision. The skin was closed with a 4-0 Vicryl subcuticular stitch. The patient tolerated the procedure well. Sponge, lap, instrument and needle counts were correct x 2. She was taken  to the recovery room in stable condition.    Hospital Course:  Active Problems:   PROM (premature rupture of membranes)   Jane Sherman is a 39 y.o. [id number] s/p pLTCS.  Patient was admitted 03/11/15 for SROM  Had arrest of dilation and needed cesarean section, see operative note above.  She has postpartum course that was uncomplicated including no problems with ambulating, PO intake, urination, pain, or bleeding. The pt feels ready to go home and  will be discharged with outpatient follow-up.   Today: No acute events overnight.  Pt denies problems with ambulating, voiding or po intake.  She denies nausea or vomiting.  Pain is well controlled.  She has had flatus. She has had bowel movement.  Lochia Minimal.  Plan for birth control is  Depo-Provera.  Method of Feeding: Bottle  Physical Exam:  BP 102/61 mmHg  Pulse 78  Temp(Src) 97.8 F (36.6 C) (Oral)  Resp 16  Ht  (1.651 m)  Wt 214 lb (97.07 kg)  BMI 35.61 kg/m2  SpO2 98%  LMP 05/30/2014  Breastfeeding? Unknown General: alert, cooperative and no distress Lochia: appropriate Uterine Fundus: firm Incision: healing well, no significant erythema DVT Evaluation: No evidence of DVT seen on physical exam. No cords or calf tenderness. No significant calf/ankle edema.  H/H: Lab Results  Component Value Date/Time   HGB 9.7* 03/13/2015 08:05 AM   HCT 28.2* 03/13/2015 08:05 AM    Jane Sherman is a 39 y.o. [id number] s/p pLTCS due to arrest of descent who is stable and doing well; ready to be discharged and follow up in one week in clinic.  Discharge Diagnoses: Term Pregnancy-delivered  Discharge Information: Date: 03/15/2015 Activity: pelvic rest Diet: routine  Medications: PNV, Ibuprofen and Percocet Breast feeding:  No:  Bottle Condition: stable Instructions: refer to handout Discharge to: home   Discharge Instructions    Call MD for:  redness, tenderness, or signs of infection (pain, swelling, redness, odor or green/yellow discharge around incision site)    Complete by:  As directed      Call MD for:  severe uncontrolled pain    Complete by:  As directed      Call MD for:  temperature >100.4    Complete by:  As directed      Diet - low sodium heart healthy    Complete by:  As directed      Increase activity slowly    Complete by:  As directed            Tereso Newcomer, MD 03/15/2015,7:38 AM

## 2015-03-19 ENCOUNTER — Encounter: Payer: Medicaid Other | Admitting: Obstetrics & Gynecology

## 2015-03-20 ENCOUNTER — Ambulatory Visit (INDEPENDENT_AMBULATORY_CARE_PROVIDER_SITE_OTHER): Payer: Medicaid Other | Admitting: Obstetrics and Gynecology

## 2015-03-20 ENCOUNTER — Encounter: Payer: Self-pay | Admitting: Obstetrics and Gynecology

## 2015-03-20 VITALS — BP 120/70 | Ht 65.0 in | Wt 202.0 lb

## 2015-03-20 DIAGNOSIS — Z9889 Other specified postprocedural states: Secondary | ICD-10-CM | POA: Diagnosis not present

## 2015-03-20 DIAGNOSIS — Z98891 History of uterine scar from previous surgery: Secondary | ICD-10-CM

## 2015-03-20 NOTE — Progress Notes (Signed)
Patient ID: Jane Sherman, female   DOB: Mar 10, 1976, 39 y.o.   MRN: [id number] Pt here today for post op visit. Pt states that she is very sore. Pt denies any fever. Pt states that she has been having pain. Pt states that her feet have been swelling a lot .

## 2015-03-20 NOTE — Progress Notes (Signed)
Patient ID: Jane Sherman, female   DOB: 27-Jun-1976, 39 y.o.   MRN: [id number]  Subjective:  Jane Sherman is a 39 y.o. female now 8 days status post Cesarean section.   Patient complains of sore pain to the incision area. Patient is interested in having a Depo injection here for contraception. Patient is bottle-feeding her child. She states she has an issues with keloids on her chest and shoulder. Patient states the baby was delivered face-up.  Review of Systems Negative except as noted above r diet:   normal   Bowel movements : normal, using stool softener.   Pain is controlled.    Objective:  BP 120/70 mmHg  Ht 5\' 5"  (1.651 m)  Wt 202 lb (91.627 kg)  BMI 33.61 kg/m2  LMP 05/30/2014  Breastfeeding? No General:Well developed, well nourished.  No acute distress. Abdomen: Bowel sounds normal, soft, non-tender.  Incision(s):   Healing beautifully, no drainage, no erythema, no hernia, no swelling, no dehiscence,     Assessment:  Post-Op 8 days s/p Cesarean section   Doing well postoperatively.   Plan:  1.Wound care discussed   2. . current medications. N/a 3. Activity restrictions: no lifting more than about 10 pounds 4. return to work: not applicable. 5. Follow up in 3 weeks.    This chart was SCRIBED for Christin BachJohn Esbeidy Mclaine, MD by Ronney LionSuzanne Le, ED Scribe. This patient was seen in room 1 and the patient's care was started at 12:22 PM.  I personally performed the services described in this documentation, which was SCRIBED in my presence. The recorded information has been reviewed and considered accurate. It has been edited as necessary during review. Tilda BurrowFERGUSON,Reda Gettis V, MD

## 2015-04-17 ENCOUNTER — Ambulatory Visit (INDEPENDENT_AMBULATORY_CARE_PROVIDER_SITE_OTHER): Payer: Medicaid Other | Admitting: Obstetrics and Gynecology

## 2015-04-17 VITALS — BP 122/80 | Ht 65.0 in | Wt 189.0 lb

## 2015-04-17 DIAGNOSIS — Z30013 Encounter for initial prescription of injectable contraceptive: Secondary | ICD-10-CM

## 2015-04-17 DIAGNOSIS — Z309 Encounter for contraceptive management, unspecified: Secondary | ICD-10-CM | POA: Insufficient documentation

## 2015-04-17 MED ORDER — MEDROXYPROGESTERONE ACETATE 150 MG/ML IM SUSP: 150.0000 mg | INTRAMUSCULAR | Status: DC

## 2015-04-17 NOTE — Progress Notes (Signed)
Patient ID: Jane Sensingngrid Mckissack, female   DOB: [birth year], 39 y.o.   MRN: [id number]  Subjective:  Jane Sherman is a 39 y.o. female now 5 weeks status post Cesarean section. For breech, macrosomia   Patient has no complaints following her surgery. She states she would also like to discuss the possibility of having a Depo injection. She is currently bottle-feeding. Patient has not had her period yet, although she does note some spotting following her delivery. Patient has been using a condom reliably; her last sexual activity occurred last week.  Review of Systems Negative except as noted above r diet:   normal   Bowel movements : normal.  The patient is not having any pain.  Objective:  BP 122/80 mmHg  Ht 5\' 5"  (1.651 m)  Wt 189 lb (85.73 kg)  BMI 31.45 kg/m2  Breastfeeding? No General:Well developed, well nourished.  No acute distress. Abdomen: Bowel sounds normal, soft, non-tender. Clean, healing well, well-approximated incision.  Incision(s):   Healing well, no drainage, no erythema, no hernia, no swelling, no dehiscence,     Assessment:  Post-Op 5 weeks s/p Cesarean section.   Doing well postoperatively.   Plan:  1.Wound care discussed   2. . current medications. n/a 3. Activity restrictions: none 4. return to work: not applicable. 5. Follow up in 24-48 hours for Depo Provera shot.  This chart was SCRIBED for Christin BachJohn Onedia Vargus, MD by Ronney LionSuzanne Le, ED Scribe. This patient was seen in room 1 and the patient's care was started at 11:25 AM. .I personally performed the services described in this documentation, which was SCRIBED in my presence. The recorded information has been reviewed and considered accurate. It has been edited as necessary during review. Tilda BurrowFERGUSON,Delaynie Stetzer V, MD

## 2015-04-17 NOTE — Progress Notes (Signed)
Patient ID: Jane Sherman, female   DOB: 1976/05/09, 39 y.o.   MRN: [id number] Pt here today for post op visit and Rx for her DEPO. Pt states that her incision is better.

## 2015-04-18 LAB — HCG, SERUM, QUALITATIVE: hCG,Beta Subunit,Qual,Serum: NEGATIVE m[IU]/mL (ref <6)

## 2015-04-19 ENCOUNTER — Encounter: Payer: Self-pay | Admitting: *Deleted

## 2015-04-19 ENCOUNTER — Other Ambulatory Visit: Payer: Self-pay | Admitting: Adult Health

## 2015-04-19 ENCOUNTER — Ambulatory Visit (INDEPENDENT_AMBULATORY_CARE_PROVIDER_SITE_OTHER): Payer: Medicaid Other | Admitting: *Deleted

## 2015-04-19 DIAGNOSIS — Z30013 Encounter for initial prescription of injectable contraceptive: Secondary | ICD-10-CM | POA: Diagnosis not present

## 2015-04-19 MED ORDER — MEDROXYPROGESTERONE ACETATE 150 MG/ML IM SUSP
150.0000 mg | Freq: Once | INTRAMUSCULAR | Status: AC
  Administered 2015-04-19: 150 mg via INTRAMUSCULAR

## 2015-04-19 NOTE — Progress Notes (Signed)
Patient ID: Jane Sherman, female   DOB: [birth year], 39 y.o.   MRN: [id number] Pt had QHCG on 04/17/15 that was negative. Pt denies any sex since before then. Pt given DEPO injection and tolerated well.

## 2015-04-25 LAB — US OB FOLLOW UP

## 2015-05-13 ENCOUNTER — Ambulatory Visit: Payer: Medicaid Other | Admitting: Women's Health

## 2015-05-13 ENCOUNTER — Encounter: Payer: Self-pay | Admitting: *Deleted

## 2015-07-12 ENCOUNTER — Ambulatory Visit: Payer: Medicaid Other

## 2015-07-16 ENCOUNTER — Ambulatory Visit: Payer: Medicaid Other

## 2015-07-18 ENCOUNTER — Ambulatory Visit (INDEPENDENT_AMBULATORY_CARE_PROVIDER_SITE_OTHER): Payer: Medicaid Other | Admitting: *Deleted

## 2015-07-18 ENCOUNTER — Encounter: Payer: Self-pay | Admitting: *Deleted

## 2015-07-18 DIAGNOSIS — Z3202 Encounter for pregnancy test, result negative: Secondary | ICD-10-CM

## 2015-07-18 DIAGNOSIS — Z3042 Encounter for surveillance of injectable contraceptive: Secondary | ICD-10-CM

## 2015-07-18 LAB — POCT URINE PREGNANCY: PREG TEST UR: NEGATIVE

## 2015-07-18 MED ORDER — MEDROXYPROGESTERONE ACETATE 150 MG/ML IM SUSP
150.0000 mg | Freq: Once | INTRAMUSCULAR | Status: AC
  Administered 2015-07-18: 150 mg via INTRAMUSCULAR

## 2015-07-18 NOTE — Progress Notes (Signed)
Pt here for Depo. Reports no problems at this time. Return in 12 weeks for next shot. JSY 

## 2015-10-10 ENCOUNTER — Encounter: Payer: Self-pay | Admitting: *Deleted

## 2015-10-10 ENCOUNTER — Ambulatory Visit: Payer: Medicaid Other

## 2016-10-18 ENCOUNTER — Inpatient Hospital Stay (HOSPITAL_COMMUNITY): Payer: Medicaid Other | Admitting: Anesthesiology

## 2016-10-18 ENCOUNTER — Encounter (HOSPITAL_COMMUNITY): Payer: Self-pay | Admitting: *Deleted

## 2016-10-18 ENCOUNTER — Inpatient Hospital Stay (HOSPITAL_COMMUNITY): Payer: Medicaid Other

## 2016-10-18 ENCOUNTER — Encounter (HOSPITAL_COMMUNITY)
Admission: AD | Disposition: A | Discharge status: Home / Self Care | Payer: Self-pay | Source: Ambulatory Visit | Attending: Obstetrics & Gynecology

## 2016-10-18 ENCOUNTER — Observation Stay (HOSPITAL_COMMUNITY)
Admission: AD | Admit: 2016-10-18 | Discharge: 2016-10-19 | Disposition: A | Discharge status: Home / Self Care | Payer: Medicaid Other | Source: Ambulatory Visit | Attending: Obstetrics & Gynecology | Admitting: Obstetrics & Gynecology

## 2016-10-18 DIAGNOSIS — O3680X0 Pregnancy with inconclusive fetal viability, not applicable or unspecified: Secondary | ICD-10-CM

## 2016-10-18 DIAGNOSIS — Z91040 Latex allergy status: Secondary | ICD-10-CM

## 2016-10-18 DIAGNOSIS — O00102 Left tubal pregnancy without intrauterine pregnancy: Principal | ICD-10-CM | POA: Insufficient documentation

## 2016-10-18 DIAGNOSIS — O009 Unspecified ectopic pregnancy without intrauterine pregnancy: Secondary | ICD-10-CM | POA: Diagnosis present

## 2016-10-18 DIAGNOSIS — E119 Type 2 diabetes mellitus without complications: Secondary | ICD-10-CM | POA: Insufficient documentation

## 2016-10-18 DIAGNOSIS — Z87891 Personal history of nicotine dependence: Secondary | ICD-10-CM

## 2016-10-18 DIAGNOSIS — Z3A14 14 weeks gestation of pregnancy: Secondary | ICD-10-CM

## 2016-10-18 DIAGNOSIS — O209 Hemorrhage in early pregnancy, unspecified: Secondary | ICD-10-CM

## 2016-10-18 DIAGNOSIS — O24911 Unspecified diabetes mellitus in pregnancy, first trimester: Secondary | ICD-10-CM | POA: Diagnosis not present

## 2016-10-18 HISTORY — PX: DIAGNOSTIC LAPAROSCOPY WITH REMOVAL OF ECTOPIC PREGNANCY: SHX6449

## 2016-10-18 LAB — CBC WITH DIFFERENTIAL/PLATELET
Basophils Absolute: 0 10*3/uL (ref 0.0–0.1)
Basophils Relative: 0 %
EOS PCT: 1 %
Eosinophils Absolute: 0.1 10*3/uL (ref 0.0–0.7)
HCT: 39.4 % (ref 36.0–46.0)
Hemoglobin: 13.5 g/dL (ref 12.0–15.0)
LYMPHS ABS: 2.2 10*3/uL (ref 0.7–4.0)
Lymphocytes Relative: 18 %
MCH: 31.4 pg (ref 26.0–34.0)
MCHC: 34.3 g/dL (ref 30.0–36.0)
MCV: 91.6 fL (ref 78.0–100.0)
MONOS PCT: 4 %
Monocytes Absolute: 0.4 10*3/uL (ref 0.1–1.0)
Neutro Abs: 9.2 10*3/uL — ABNORMAL HIGH (ref 1.7–7.7)
Neutrophils Relative %: 77 %
Platelets: 263 10*3/uL (ref 150–400)
RBC: 4.3 MIL/uL (ref 3.87–5.11)
RDW: 13.1 % (ref 11.5–15.5)
WBC: 12 10*3/uL — ABNORMAL HIGH (ref 4.0–10.5)

## 2016-10-18 LAB — COMPREHENSIVE METABOLIC PANEL
ALK PHOS: 82 U/L (ref 38–126)
ALT: 28 U/L (ref 14–54)
ANION GAP: 3 — AB (ref 5–15)
AST: 21 U/L (ref 15–41)
Albumin: 3.8 g/dL (ref 3.5–5.0)
BILIRUBIN TOTAL: 1.8 mg/dL — AB (ref 0.3–1.2)
BUN: 8 mg/dL (ref 6–20)
CALCIUM: 8.9 mg/dL (ref 8.9–10.3)
CO2: 23 mmol/L (ref 22–32)
Chloride: 110 mmol/L (ref 101–111)
Creatinine, Ser: 0.66 mg/dL (ref 0.44–1.00)
Glucose, Bld: 113 mg/dL — ABNORMAL HIGH (ref 65–99)
Potassium: 4.6 mmol/L (ref 3.5–5.1)
Sodium: 136 mmol/L (ref 135–145)
TOTAL PROTEIN: 6.4 g/dL — AB (ref 6.5–8.1)

## 2016-10-18 LAB — CBC
HCT: 35.6 % — ABNORMAL LOW (ref 36.0–46.0)
Hemoglobin: 12.1 g/dL (ref 12.0–15.0)
MCH: 31.3 pg (ref 26.0–34.0)
MCHC: 34 g/dL (ref 30.0–36.0)
MCV: 92.2 fL (ref 78.0–100.0)
Platelets: 246 10*3/uL (ref 150–400)
RBC: 3.86 MIL/uL — ABNORMAL LOW (ref 3.87–5.11)
RDW: 13.2 % (ref 11.5–15.5)
WBC: 15.7 10*3/uL — ABNORMAL HIGH (ref 4.0–10.5)

## 2016-10-18 LAB — HCG, QUANTITATIVE, PREGNANCY
HCG, BETA CHAIN, QUANT, S: 157 m[IU]/mL — AB (ref <5)
hCG, Beta Chain, Quant, S: 137 m[IU]/mL — ABNORMAL HIGH (ref <5)

## 2016-10-18 LAB — POCT PREGNANCY, URINE: Preg Test, Ur: POSITIVE — AB

## 2016-10-18 LAB — TYPE AND SCREEN
ABO/RH(D): O POS
Antibody Screen: NEGATIVE

## 2016-10-18 SURGERY — LAPAROSCOPY, WITH ECTOPIC PREGNANCY SURGICAL TREATMENT
Anesthesia: General | Site: Abdomen

## 2016-10-18 MED ORDER — ZOLPIDEM TARTRATE 5 MG PO TABS: 5.0000 mg | ORAL_TABLET | Freq: Every evening | ORAL | Status: DC | PRN

## 2016-10-18 MED ORDER — SUCCINYLCHOLINE CHLORIDE 200 MG/10ML IV SOSY
PREFILLED_SYRINGE | INTRAVENOUS | Status: DC | PRN
  Administered 2016-10-18: 60 mg via INTRAVENOUS

## 2016-10-18 MED ORDER — ROCURONIUM BROMIDE 100 MG/10ML IV SOLN
INTRAVENOUS | Status: DC | PRN
  Administered 2016-10-18: 20 mg via INTRAVENOUS
  Administered 2016-10-18: 10 mg via INTRAVENOUS

## 2016-10-18 MED ORDER — MIDAZOLAM HCL 5 MG/5ML IJ SOLN
INTRAMUSCULAR | Status: DC | PRN
  Administered 2016-10-18: 2 mg via INTRAVENOUS

## 2016-10-18 MED ORDER — FENTANYL CITRATE (PF) 100 MCG/2ML IJ SOLN
INTRAMUSCULAR | Status: AC
  Filled 2016-10-18: qty 2

## 2016-10-18 MED ORDER — DEXAMETHASONE SODIUM PHOSPHATE 10 MG/ML IJ SOLN
INTRAMUSCULAR | Status: DC | PRN
  Administered 2016-10-18: 10 mg via INTRAVENOUS

## 2016-10-18 MED ORDER — SUCCINYLCHOLINE CHLORIDE 200 MG/10ML IV SOSY
PREFILLED_SYRINGE | INTRAVENOUS | Status: AC
  Filled 2016-10-18: qty 10

## 2016-10-18 MED ORDER — DEXAMETHASONE SODIUM PHOSPHATE 10 MG/ML IJ SOLN
INTRAMUSCULAR | Status: AC
  Filled 2016-10-18: qty 1

## 2016-10-18 MED ORDER — GLYCOPYRROLATE 0.2 MG/ML IJ SOLN
INTRAMUSCULAR | Status: AC
  Filled 2016-10-18: qty 4

## 2016-10-18 MED ORDER — OXYCODONE HCL 5 MG/5ML PO SOLN: 5.0000 mg | Freq: Once | ORAL | Status: DC | PRN

## 2016-10-18 MED ORDER — ONDANSETRON HCL 4 MG/2ML IJ SOLN
4.0000 mg | Freq: Four times a day (QID) | INTRAMUSCULAR | Status: DC | PRN
  Administered 2016-10-18: 4 mg via INTRAVENOUS
  Filled 2016-10-18: qty 2

## 2016-10-18 MED ORDER — PROPOFOL 10 MG/ML IV BOLUS
INTRAVENOUS | Status: DC | PRN
  Administered 2016-10-18: 140 mg via INTRAVENOUS
  Administered 2016-10-18: 60 mg via INTRAVENOUS

## 2016-10-18 MED ORDER — FAMOTIDINE IN NACL 20-0.9 MG/50ML-% IV SOLN
20.0000 mg | Freq: Once | INTRAVENOUS | Status: AC
  Administered 2016-10-18: 20 mg via INTRAVENOUS
  Filled 2016-10-18: qty 50

## 2016-10-18 MED ORDER — HYDROMORPHONE HCL 1 MG/ML IJ SOLN
1.0000 mg | Freq: Once | INTRAMUSCULAR | Status: AC
  Administered 2016-10-18: 1 mg via INTRAVENOUS
  Filled 2016-10-18: qty 1

## 2016-10-18 MED ORDER — OXYCODONE HCL 5 MG PO TABS: 5.0000 mg | ORAL_TABLET | Freq: Once | ORAL | Status: DC | PRN

## 2016-10-18 MED ORDER — CEFAZOLIN SODIUM-DEXTROSE 2-4 GM/100ML-% IV SOLN
2.0000 g | INTRAVENOUS | Status: DC
  Filled 2016-10-18: qty 100

## 2016-10-18 MED ORDER — ONDANSETRON HCL 4 MG/2ML IJ SOLN
INTRAMUSCULAR | Status: DC | PRN
  Administered 2016-10-18: 4 mg via INTRAVENOUS

## 2016-10-18 MED ORDER — LIDOCAINE HCL (CARDIAC) 20 MG/ML IV SOLN
INTRAVENOUS | Status: DC | PRN
  Administered 2016-10-18: 70 mg via INTRAVENOUS

## 2016-10-18 MED ORDER — MIDAZOLAM HCL 2 MG/2ML IJ SOLN
INTRAMUSCULAR | Status: AC
  Filled 2016-10-18: qty 2

## 2016-10-18 MED ORDER — SUGAMMADEX SODIUM 200 MG/2ML IV SOLN
INTRAVENOUS | Status: DC | PRN
  Administered 2016-10-18: 200 mg via INTRAVENOUS

## 2016-10-18 MED ORDER — PROPOFOL 10 MG/ML IV BOLUS
INTRAVENOUS | Status: AC
  Filled 2016-10-18: qty 20

## 2016-10-18 MED ORDER — NEOSTIGMINE METHYLSULFATE 10 MG/10ML IV SOLN
INTRAVENOUS | Status: AC
  Filled 2016-10-18: qty 1

## 2016-10-18 MED ORDER — SOD CITRATE-CITRIC ACID 500-334 MG/5ML PO SOLN
30.0000 mL | Freq: Once | ORAL | Status: AC
  Administered 2016-10-18: 30 mL via ORAL
  Filled 2016-10-18: qty 15

## 2016-10-18 MED ORDER — ONDANSETRON HCL 4 MG PO TABS: 4.0000 mg | ORAL_TABLET | Freq: Four times a day (QID) | ORAL | Status: DC | PRN

## 2016-10-18 MED ORDER — METHOTREXATE INJECTION FOR WOMEN'S HOSPITAL
50.0000 mg/m2 | Freq: Once | INTRAMUSCULAR | Status: AC
  Administered 2016-10-18: 100 mg via INTRAMUSCULAR
  Filled 2016-10-18: qty 2

## 2016-10-18 MED ORDER — PROMETHAZINE HCL 25 MG PO TABS: 12.5000 mg | ORAL_TABLET | Freq: Four times a day (QID) | ORAL | Status: DC | PRN

## 2016-10-18 MED ORDER — FENTANYL CITRATE (PF) 250 MCG/5ML IJ SOLN
INTRAMUSCULAR | Status: DC | PRN
  Administered 2016-10-18: 50 ug via INTRAVENOUS
  Administered 2016-10-18 (×2): 100 ug via INTRAVENOUS

## 2016-10-18 MED ORDER — HYDROCODONE-ACETAMINOPHEN 5-325 MG PO TABS
1.0000 | ORAL_TABLET | ORAL | Status: DC | PRN
  Administered 2016-10-19: 1 via ORAL
  Administered 2016-10-19 (×2): 2 via ORAL
  Filled 2016-10-18: qty 1
  Filled 2016-10-18 (×2): qty 2

## 2016-10-18 MED ORDER — LIDOCAINE HCL (CARDIAC) 20 MG/ML IV SOLN
INTRAVENOUS | Status: AC
  Filled 2016-10-18: qty 5

## 2016-10-18 MED ORDER — LACTATED RINGERS IV BOLUS (SEPSIS)
1000.0000 mL | Freq: Once | INTRAVENOUS | Status: AC
  Administered 2016-10-18: 1000 mL via INTRAVENOUS

## 2016-10-18 MED ORDER — HYDROMORPHONE HCL 2 MG/ML IJ SOLN
2.0000 mg | Freq: Once | INTRAMUSCULAR | Status: AC
  Administered 2016-10-18: 2 mg via INTRAVENOUS
  Filled 2016-10-18: qty 1

## 2016-10-18 MED ORDER — FENTANYL CITRATE (PF) 100 MCG/2ML IJ SOLN
25.0000 ug | INTRAMUSCULAR | Status: DC | PRN
  Administered 2016-10-18 (×3): 50 ug via INTRAVENOUS

## 2016-10-18 MED ORDER — ONDANSETRON HCL 4 MG/2ML IJ SOLN
INTRAMUSCULAR | Status: AC
  Filled 2016-10-18: qty 2

## 2016-10-18 MED ORDER — LACTATED RINGERS IV SOLN
INTRAVENOUS | Status: DC | PRN
  Administered 2016-10-18 (×2): via INTRAVENOUS

## 2016-10-18 MED ORDER — LACTATED RINGERS IV SOLN
INTRAVENOUS | Status: DC
  Administered 2016-10-18 – 2016-10-19 (×2): via INTRAVENOUS

## 2016-10-18 MED ORDER — BUPIVACAINE HCL (PF) 0.25 % IJ SOLN
INTRAMUSCULAR | Status: DC | PRN
  Administered 2016-10-18: 10 mL

## 2016-10-18 MED ORDER — SIMETHICONE 80 MG PO CHEW: 80.0000 mg | CHEWABLE_TABLET | Freq: Four times a day (QID) | ORAL | Status: DC | PRN

## 2016-10-18 MED ORDER — LACTATED RINGERS IR SOLN
Status: DC | PRN
  Administered 2016-10-18: 3000 mL

## 2016-10-18 MED ORDER — LACTATED RINGERS IV SOLN
INTRAVENOUS | Status: DC
  Administered 2016-10-18: 19:00:00 via INTRAVENOUS

## 2016-10-18 MED ORDER — FENTANYL CITRATE (PF) 250 MCG/5ML IJ SOLN
INTRAMUSCULAR | Status: AC
  Filled 2016-10-18: qty 5

## 2016-10-18 MED ORDER — SUGAMMADEX SODIUM 200 MG/2ML IV SOLN
INTRAVENOUS | Status: AC
  Filled 2016-10-18: qty 2

## 2016-10-18 MED ORDER — ONDANSETRON HCL 4 MG/2ML IJ SOLN
4.0000 mg | Freq: Once | INTRAMUSCULAR | Status: AC
  Administered 2016-10-18: 4 mg via INTRAVENOUS
  Filled 2016-10-18: qty 2

## 2016-10-18 SURGICAL SUPPLY — 36 items
APPLICATOR COTTON TIP 6IN STRL (MISCELLANEOUS) ×7 IMPLANT
BAG SPEC RTRVL LRG 6X4 10 (ENDOMECHANICALS)
BLADE SURG 15 STRL LF C SS BP (BLADE) ×1 IMPLANT
BLADE SURG 15 STRL SS (BLADE) ×3
CLOTH BEACON ORANGE TIMEOUT ST (SAFETY) ×3 IMPLANT
DURAPREP 26ML APPLICATOR (WOUND CARE) ×3 IMPLANT
GLOVE BIO SURGEON STRL SZ7 (GLOVE) ×1 IMPLANT
GLOVE BIOGEL PI IND STRL 7.0 (GLOVE) ×2 IMPLANT
GLOVE BIOGEL PI INDICATOR 7.0 (GLOVE) ×4
GLOVE SKINSENSE NS SZ6.5 (GLOVE) ×2
GLOVE SKINSENSE NS SZ7.0 (GLOVE) ×2
GLOVE SKINSENSE STRL SZ6.5 (GLOVE) IMPLANT
GLOVE SKINSENSE STRL SZ7.0 (GLOVE) IMPLANT
GOWN STRL REUS W/TWL LRG LVL3 (GOWN DISPOSABLE) ×6 IMPLANT
NEEDLE INSUFFLATION 120MM (ENDOMECHANICALS) IMPLANT
NS IRRIG 1000ML POUR BTL (IV SOLUTION) ×1 IMPLANT
PACK LAPAROSCOPY BASIN (CUSTOM PROCEDURE TRAY) ×3 IMPLANT
PACK TRENDGUARD 450 HYBRID PRO (MISCELLANEOUS) IMPLANT
PACK TRENDGUARD 600 HYBRD PROC (MISCELLANEOUS) IMPLANT
POUCH SPECIMEN RETRIEVAL 10MM (ENDOMECHANICALS) IMPLANT
PROTECTOR NERVE ULNAR (MISCELLANEOUS) ×6 IMPLANT
SET IRRIG TUBING LAPAROSCOPIC (IRRIGATION / IRRIGATOR) ×2 IMPLANT
SHEARS HARMONIC ACE PLUS 36CM (ENDOMECHANICALS) IMPLANT
SLEEVE XCEL OPT CAN 5 100 (ENDOMECHANICALS) ×3 IMPLANT
SUT VICRYL 0 ENDOLOOP (SUTURE) IMPLANT
SUT VICRYL 0 UR6 27IN ABS (SUTURE) ×5 IMPLANT
SUT VICRYL 4-0 PS2 18IN ABS (SUTURE) ×5 IMPLANT
TOWEL OR 17X24 6PK STRL BLUE (TOWEL DISPOSABLE) ×6 IMPLANT
TRAY FOLEY BAG SILVER LF 16FR (CATHETERS) ×2 IMPLANT
TRAY FOLEY CATH SILVER 14FR (SET/KITS/TRAYS/PACK) ×1 IMPLANT
TRENDGUARD 450 HYBRID PRO PACK (MISCELLANEOUS) ×3
TRENDGUARD 600 HYBRID PROC PK (MISCELLANEOUS)
TROCAR BALLN 12MMX100 BLUNT (TROCAR) IMPLANT
TROCAR XCEL NON-BLD 11X100MML (ENDOMECHANICALS) ×2 IMPLANT
TROCAR XCEL NON-BLD 5MMX100MML (ENDOMECHANICALS) ×3 IMPLANT
WATER STERILE IRR 1000ML POUR (IV SOLUTION) ×1 IMPLANT

## 2016-10-18 NOTE — Anesthesia Postprocedure Evaluation (Signed)
Anesthesia Post Note  Patient: Jane Sherman  Procedure(s) Performed: Procedure(s) (LRB): DIAGNOSTIC LAPAROSCOPY WITH Removal Peritoneal Contents, Lysis of Adhesions, Peritoneal Lavage (N/A)  Patient location during evaluation: PACU Anesthesia Type: General Level of consciousness: awake and alert and oriented Pain management: pain level controlled Vital Signs Assessment: post-procedure vital signs reviewed and stable Respiratory status: spontaneous breathing, nonlabored ventilation and respiratory function stable Cardiovascular status: blood pressure returned to baseline and stable Postop Assessment: no signs of nausea or vomiting Anesthetic complications: no     Last Vitals:  Vitals:   10/18/16 1915 10/18/16 1930  BP: 124/82 110/79  Pulse: 94 90  Resp: 15 17  Temp: 36.6 C     Last Pain:  Vitals:   10/18/16 1930  TempSrc:   PainSc: 3    Pain Goal: Patients Stated Pain Goal: 3 (10/18/16 1930)               Damia Bobrowski A.

## 2016-10-18 NOTE — Anesthesia Preprocedure Evaluation (Signed)
Anesthesia Evaluation  Patient identified by MRN, date of birth, ID band Patient awake    Reviewed: Allergy & Precautions, NPO status , Patient's Chart, lab work & pertinent test results  History of Anesthesia Complications Negative for: history of anesthetic complications  Airway Mallampati: II  TM Distance: >3 FB Neck ROM: Full    Dental  (+) Teeth Intact   Pulmonary former smoker,    breath sounds clear to auscultation       Cardiovascular negative cardio ROS   Rhythm:Regular     Neuro/Psych negative neurological ROS  negative psych ROS   GI/Hepatic negative GI ROS, Neg liver ROS,   Endo/Other  diabetes  Renal/GU negative Renal ROS     Musculoskeletal   Abdominal   Peds  Hematology   Anesthesia Other Findings Ectopic pregnancy  Reproductive/Obstetrics (+) Pregnancy                             Anesthesia Physical Anesthesia Plan  ASA: II and emergent  Anesthesia Plan: General   Post-op Pain Management:    Induction: Intravenous, Rapid sequence and Cricoid pressure planned  Airway Management Planned: Oral ETT  Additional Equipment: None  Intra-op Plan:   Post-operative Plan: Extubation in OR  Informed Consent: I have reviewed the patients History and Physical, chart, labs and discussed the procedure including the risks, benefits and alternatives for the proposed anesthesia with the patient or authorized representative who has indicated his/her understanding and acceptance.   Dental advisory given  Plan Discussed with: CRNA and Surgeon  Anesthesia Plan Comments:         Anesthesia Quick Evaluation

## 2016-10-18 NOTE — Transfer of Care (Signed)
Immediate Anesthesia Transfer of Care Note  Patient: Jane Sherman  Procedure(s) Performed: Procedure(s): DIAGNOSTIC LAPAROSCOPY WITH Removal Peritoneal Contents, Lysis of Adhesions, Peritoneal Lavage (N/A)  Patient Location: PACU  Anesthesia Type:General  Level of Consciousness: awake  Airway & Oxygen Therapy: Patient Spontanous Breathing and Patient connected to nasal cannula oxygen  Post-op Assessment: Report given to RN and Post -op Vital signs reviewed and stable  Post vital signs: stable  Last Vitals:  Vitals:   10/18/16 1624 10/18/16 1818  BP: 111/64 (P) 124/80  Pulse: 90 (!) 120  Resp: 20 (P) 18  Temp:  (P) 36.4 C    Last Pain:  Vitals:   10/18/16 1536  TempSrc:   PainSc: 5          Complications: No apparent anesthesia complications

## 2016-10-18 NOTE — Plan of Care (Signed)
Problem: Education: Goal: Knowledge of [facility] Education information/materials will improve Outcome: Completed/Met Date Met: 10/18/16 Oriented to room,equipment,plan of care,pain management,and medications.  Problem: Safety: Goal: Ability to remain free from injury will improve Outcome: Completed/Met Date Met: 10/18/16 First time up taken to bathroom with the use of the Stedy and second trip walked well with assist.  Problem: Pain Managment: Goal: General experience of comfort will improve Outcome: Progressing Good pain control with IV Dilaudid but will have po pain med next then will re evaluate if this is adequate.  Problem: Physical Regulation: Goal: Ability to maintain clinical measurements within normal limits will improve Outcome: Completed/Met Date Met: 10/18/16 Vital signs stable at this time.  Problem: Tissue Perfusion: Goal: Risk factors for ineffective tissue perfusion will decrease Outcome: Completed/Met Date Met: 10/18/16 Has on SCD's and can get her I/S up to 1750 at this time.

## 2016-10-18 NOTE — Op Note (Signed)
10/18/2016  7:07 PM  PATIENT:  Jane SensingIngrid Sherman  40 y.o. female  PRE-OPERATIVE DIAGNOSIS:  40 yo female with US findings of left ruptured ectopic  POST-OPERATIVE DIAGNOSIS:  Pregnancy of unknown location, hemoperitonium  PROCEDURE:  Procedure(s): DIAGNOSTIC LAPAROSCOPY WITH Removal Peritoneal Contents, Lysis of Adhesions, Peritoneal Lavage (N/A)  SURGEON:  Surgeon(s) and Role:    * Lesly DukesKelly Sherman Skilar Marcou, MD - Primary  ASSISTANTS: none   ANESTHESIA:   general  EBL:  100 cc of blood and clot upon entry, no blood loss during case.  BLOOD ADMINISTERED: none  DRAINS: none   LOCAL MEDICATIONS USED:  MARCAINE     SPECIMEN:  Lavage/Washing  DISPOSITION OF SPECIMEN:  PATHOLOGY  COUNTS:  YES  TOURNIQUET:  * No tourniquets in log *  DICTATION: .Note written in EPIC  PLAN OF CARE: Admit for overnight observation  PATIENT DISPOSITION:  PACU - hemodynamically stable.   Delay start of Pharmacological VTE agent (>24hrs) due to surgical blood loss or risk of bleeding: yes  FINDINGS:  moderate amount of hemoperitoneum estimated to be about 100cc of blood and clots.  Normal right fallopian tube. Normal left fallopian tube.  Both ovaries were normal.  Cul de sac was clear of lesion or implantation.  Both cornua were hyperemic.  Right side of uterus slightly arger than left side of uterus but no definitive bulge or resectable mass.  Upper abdomen was clear, gall bladder present and not inflamed.  Beta Hcg Quant = 137   PROCEDURE IN DETAIL:  The patient was taken to the operating room where general anesthesia was administered and was found to be adequate.  She was placed in the dorsal lithotomy position, and was prepped and draped in a sterile manner.  A Foley catheter was inserted into her bladder and attached to constant drainage and a uterine manipulator was then advanced into the uterus .  After an adequate timeout was performed, attention was then turned to the patient's abdomen where a  10-mm skin incision was made on the umbilical fold.  The 10/11 XL Optiview trocar was used to enter the abdomen.  Adequate pneumoperitoneum was obtained, and the 10-mm trocar and sleeve were then advanced without difficulty into the abdomen where intraabdominal placement was confirmed by the laparoscope. A survey of the patient's pelvis and abdomen revealed the findings as above.  Bilateral 5-mm lower quadrant ports were placed under direct visualization.  The 10-mm Nezhat suction irrigator was then used to suction the hemoperitoneum and irrigate the pelvis. Survey of the abdominal contents was completed with above findings.  One omental adhesion was taken down from the abdominal wall with bipolar and sharp dissection.  Mixed in the blood was some questionable tissue.  That was removed through the trocar and sent to pathology.   The abdomen was deflated and all trocars were removed from the abdomen.  The umbilical skin incision was closed with 3-0 Vicryl.  The 2 lower ports were closed with Dermabond.     The patient tolerated the procedures well.  All instruments, needles, and sponge counts were correct x 2. The patient was taken to the recovery room in stable condition.   Jane Sherman. 10/18/2016 7:12 PM

## 2016-10-18 NOTE — H&P (Signed)
History     CSN: [id number]  Arrival date and time: 10/18/16 1346    First Provider Initiated Contact with Patient 10/18/16 1410        Chief Complaint  Patient presents with  . Vaginal Bleeding  . Abdominal Pain   HPI  Ms. Danella Sensingngrid Utley is a 40 y.o. [phone] at Unknown who presents to [facility]U today with complaint of vaginal bleeding and lower abdominal pain. The patient had not taken HPT. She states Depo Provera given ~ 1 month ago. She had intercourse 5 days after Depo Provera injection, unprotected. She states some bleeding noted since then, but this morning bleeding became very heavy and pain is severe in the lower abdomen. She also endorses nausea without vomiting. She denies fever.   OB History    Gravida Para Term Preterm AB Living   5 2 2  0 2 2   SAB TAB Ectopic Multiple Live Births   2 0 0 0 2      Past Medical History:  Diagnosis Date  . Abnormal Pap smear of cervix   . Contraceptive management 01/12/2014  . Gestational diabetes    with previous pregnancy  . Herpes 04/17/2014  . HPV (human papilloma virus) infection   . Labial irritation 01/12/2014   ?herpes  . Pelvic pain in pregnant patient at less than [redacted] weeks gestation 07/04/2014  . Pregnant state, incidental 07/04/2014    Past Surgical History:  Procedure Laterality Date  . CERVICAL BIOPSY    . CESAREAN SECTION N/A 03/12/2015   Procedure: CESAREAN SECTION;  Surgeon: Levie HeritageJacob J Stinson, DO;  Location: [facility];  Service: Obstetrics;  Laterality: N/A;  . COLPOSCOPY  09/2013  . TONSILLECTOMY      Family History  Problem Relation Age of Onset  . Diabetes Mother   . Diabetes Maternal Grandmother     Social History  Substance Use Topics  . Smoking status: Former Games developermoker  . Smokeless tobacco: Never Used  . Alcohol use No     Comment: social    Allergies:  Allergies  Allergen Reactions  . Latex Other (See Comments)    Causes redness and soreness.    Prescriptions Prior to Admission  Medication Sig  Dispense Refill Last Dose  . medroxyPROGESTERone (DEPO-PROVERA) 150 MG/ML injection Inject 1 mL (150 mg total) into the muscle every 3 (three) months. 1 mL 3 Taking  . valACYclovir (VALTREX) 1000 MG tablet TAKE ONE TABLET BY MOUTH ONCE DAILY (Patient taking differently: prn) 30 tablet 6 Taking    Review of Systems  Constitutional: Negative for fever and malaise/fatigue.  Gastrointestinal: Positive for abdominal pain. Negative for constipation, diarrhea, nausea and vomiting.  Genitourinary: Negative for dysuria, frequency and urgency.   Physical Exam   Blood pressure 120/85, pulse 85, temperature 97.1 F (36.2 C), temperature source Oral, resp. rate 20, height 5\' 5"  (1.651 m), weight 185 lb (83.9 kg), not currently breastfeeding.  Physical Exam  Nursing note and vitals reviewed. Constitutional: She is oriented to person, place, and time. She appears well-developed and well-nourished. No distress.  Appears uncomfortable  HENT:  Head: Normocephalic and atraumatic.  Cardiovascular: Normal rate.   Respiratory: Effort normal.  GI: Soft. She exhibits no distension and no mass. There is tenderness (moderate tenderness to palpation of the lower abdomen bilaterally). There is no rebound and no guarding.  Genitourinary: Cervix exhibits no friability. There is bleeding (large amount of blood with numerous large clots removed from the vagina) in the vagina.  Genitourinary Comments: Cervix:  visually closed  Neurological: She is alert and oriented to person, place, and time.  Skin: Skin is warm and dry. No erythema.  Psychiatric: She has a normal mood and affect.   Results for orders placed or performed during the hospital encounter of 10/18/16 (from the past 24 hour(s))  Pregnancy, urine POC     Status: Abnormal   Collection Time: 10/18/16  2:21 PM  Result Value Ref Range   Preg Test, Ur POSITIVE (A) NEGATIVE  CBC with Differential/Platelet     Status: Abnormal   Collection Time: 10/18/16  2:45  PM  Result Value Ref Range   WBC 12.0 (H) 4.0 - 10.5 K/uL   RBC 4.30 3.87 - 5.11 MIL/uL   Hemoglobin 13.5 12.0 - 15.0 g/dL   HCT 10.9 60.4 - 54.0 %   MCV 91.6 78.0 - 100.0 fL   MCH 31.4 26.0 - 34.0 pg   MCHC 34.3 30.0 - 36.0 g/dL   RDW 98.1 19.1 - 47.8 %   Platelets 263 150 - 400 K/uL   Neutrophils Relative % 77 %   Neutro Abs 9.2 (H) 1.7 - 7.7 K/uL   Lymphocytes Relative 18 %   Lymphs Abs 2.2 0.7 - 4.0 K/uL   Monocytes Relative 4 %   Monocytes Absolute 0.4 0.1 - 1.0 K/uL   Eosinophils Relative 1 %   Eosinophils Absolute 0.1 0.0 - 0.7 K/uL   Basophils Relative 0 %   Basophils Absolute 0.0 0.0 - 0.1 K/uL  hCG, quantitative, pregnancy     Status: Abnormal   Collection Time: 10/18/16  3:05 PM  Result Value Ref Range   hCG, Beta Chain, Quant, S 157 (H) <5 mIU/mL   US Ob Comp Less 14 Wks  Result Date: 10/18/2016 CLINICAL DATA:  Vaginal bleeding EXAM: OBSTETRIC <14 WK ULTRASOUND TECHNIQUE: Transabdominal ultrasound was performed for evaluation of the gestation as well as the maternal uterus and adnexal regions. COMPARISON:  None. FINDINGS: Intrauterine gestational sac: Not visualized Yolk sac:  Not visualized Embryo:  Not visualized Cardiac Activity: Not visualized Subchorionic hemorrhage:  None visualized. Maternal uterus/adnexae: The uterus has an inhomogeneous echotexture which may represent leiomyomatous change. The endometrium is thickened with probable hemorrhage within the endometrium. Right ovary appears unremarkable. A normal left ovary is not seen. There is complex echogenic material in the region of the left adnexum with moderate adjacent free fluid. IMPRESSION: No intrauterine gestation is appreciable. There is complex echogenic material in the left adnexum measuring 4.7 x 2.1 x 3.0 cm with moderate nearby fluid. This finding raises concern for potential ruptured ectopic gestation. Right ovary appears normal. The uterus appears inhomogeneous, suggesting leiomyomatous change.  Hemorrhage is noted within the endometrium. Critical Value/emergent results were called by telephone at the time of interpretation on 10/18/2016 at 3:39 pm to Dr. Elsie Lincoln, OB/GYN, who verbally acknowledged these results. Electronically Signed   By: Bretta Bang III M.D.   On: 10/18/2016 15:39   US Ob Transvaginal  Result Date: 10/18/2016 CLINICAL DATA:  Vaginal bleeding EXAM: OBSTETRIC <14 WK ULTRASOUND TECHNIQUE: Transabdominal ultrasound was performed for evaluation of the gestation as well as the maternal uterus and adnexal regions. COMPARISON:  None. FINDINGS: Intrauterine gestational sac: Not visualized Yolk sac:  Not visualized Embryo:  Not visualized Cardiac Activity: Not visualized Subchorionic hemorrhage:  None visualized. Maternal uterus/adnexae: The uterus has an inhomogeneous echotexture which may represent leiomyomatous change. The endometrium is thickened with probable hemorrhage within the endometrium. Right ovary appears unremarkable. A normal left ovary  is not seen. There is complex echogenic material in the region of the left adnexum with moderate adjacent free fluid. IMPRESSION: No intrauterine gestation is appreciable. There is complex echogenic material in the left adnexum measuring 4.7 x 2.1 x 3.0 cm with moderate nearby fluid. This finding raises concern for potential ruptured ectopic gestation. Right ovary appears normal. The uterus appears inhomogeneous, suggesting leiomyomatous change. Hemorrhage is noted within the endometrium. Critical Value/emergent results were called by telephone at the time of interpretation on 10/18/2016 at 3:39 pm to Dr. Elsie LincolnKelly Davi Kroon, OB/GYN, who verbally acknowledged these results. Electronically Signed   By: Bretta BangWilliam  Woodruff III M.D.   On: 10/18/2016 15:39    MAU Course  Procedures None  MDM 1 mg Dilaudid IM given  +UPT UA, wet prep, GC/chlamydia, CBC, ABO/Rh, quant hCG, HIV, RPR and US today to rule out ectopic pregnancy IV LR bolus  ordered Patient continues to complain of significant pain 1 mg Dilaudid IV ordered  Radiologist called with concern for ruptured ectopic pregnancy.  Dr. Penne LashLeggett consulted. She is present in MAU to discuss results and management with the patient.  Assessment and Plan  A: Ruptured ectopic pregnancy   P: Patient to OR with Dr. Norton BlizzardLeggett  Julie N Wenzel, PA-C  10/18/2016, 3:47 PM   Pt seen and examined.  Reviewed US findings with the radiologist.  Pt seen and examined.  Pt consented for Laparoscopy, left salpingectomy, and removal of ectopic pregnancy. Risks include but not limited to bleeding, infection, damage to intrabdominal organs, complications from anesthesia.  There is 5 percent chance of laparotomy.  Discussed future fertility with one fallopian tube.  Pt does not want more children and she understands she is still fertile with one fallopian tube.    Ethan Kasperski H. 4:16 PM

## 2016-10-18 NOTE — Brief Op Note (Signed)
10/18/2016  7:07 PM  PATIENT:  Danella SensingIngrid Bunnell  40 y.o. female  PRE-OPERATIVE DIAGNOSIS:  40 yo female with US findings of left ruptured ectopic  POST-OPERATIVE DIAGNOSIS:  Pregnancy of unknown location, hemoperitonium  PROCEDURE:  Procedure(s): DIAGNOSTIC LAPAROSCOPY WITH Removal Peritoneal Contents, Lysis of Adhesions, Peritoneal Lavage (N/A)  SURGEON:  Surgeon(s) and Role:    * Lesly DukesKelly H Graysyn Bache, MD - Primary  ASSISTANTS: none   ANESTHESIA:   general  EBL:  100 cc of blood and clot upon entry, no blood loss during case.  BLOOD ADMINISTERED: non  DRAINS: none   LOCAL MEDICATIONS USED:  MARCAINE     SPECIMEN:  Lavage/Washing  DISPOSITION OF SPECIMEN:  PATHOLOGY  COUNTS:  YES  TOURNIQUET:  * No tourniquets in log *  DICTATION: .Note written in EPIC  PLAN OF CARE: Admit for overnight observation  PATIENT DISPOSITION:  PACU - hemodynamically stable.   Delay start of Pharmacological VTE agent (>24hrs) due to surgical blood loss or risk of bleeding: yes

## 2016-10-18 NOTE — MAU Note (Signed)
Had depo x 1 month ago, today heavy bleeding started with lots of pain/

## 2016-10-18 NOTE — Anesthesia Procedure Notes (Signed)
Procedure Name: Intubation Date/Time: 10/18/2016 5:01 PM Performed by: Renford DillsMULLINS, Wylene Weissman L Pre-anesthesia Checklist: Patient identified, Emergency Drugs available, Suction available and Patient being monitored Patient Re-evaluated:Patient Re-evaluated prior to inductionOxygen Delivery Method: Circle system utilized Preoxygenation: Pre-oxygenation with 100% oxygen Intubation Type: IV induction, Rapid sequence and Cricoid Pressure applied Grade View: Grade I Tube type: Oral Tube size: 7.0 mm Number of attempts: 1 Airway Equipment and Method: Stylet Placement Confirmation: ETT inserted through vocal cords under direct vision,  positive ETCO2 and breath sounds checked- equal and bilateral Secured at: 20 cm Tube secured with: Tape Dental Injury: Teeth and Oropharynx as per pre-operative assessment

## 2016-10-19 ENCOUNTER — Observation Stay (HOSPITAL_COMMUNITY): Payer: Medicaid Other

## 2016-10-19 ENCOUNTER — Encounter (HOSPITAL_COMMUNITY): Payer: Self-pay | Admitting: Obstetrics & Gynecology

## 2016-10-19 DIAGNOSIS — O00102 Left tubal pregnancy without intrauterine pregnancy: Secondary | ICD-10-CM | POA: Diagnosis not present

## 2016-10-19 LAB — CBC
HCT: 33.7 % — ABNORMAL LOW (ref 36.0–46.0)
Hemoglobin: 11.5 g/dL — ABNORMAL LOW (ref 12.0–15.0)
MCH: 31.3 pg (ref 26.0–34.0)
MCHC: 34.1 g/dL (ref 30.0–36.0)
MCV: 91.6 fL (ref 78.0–100.0)
PLATELETS: 251 10*3/uL (ref 150–400)
RBC: 3.68 MIL/uL — ABNORMAL LOW (ref 3.87–5.11)
RDW: 13.3 % (ref 11.5–15.5)
WBC: 10.7 10*3/uL — ABNORMAL HIGH (ref 4.0–10.5)

## 2016-10-19 LAB — HCG, QUANTITATIVE, PREGNANCY: HCG, BETA CHAIN, QUANT, S: 132 m[IU]/mL — AB (ref <5)

## 2016-10-19 MED ORDER — HYDROCODONE-ACETAMINOPHEN 5-325 MG PO TABS: 1.0000 | ORAL_TABLET | ORAL | 0 refills | Status: DC | PRN

## 2016-10-19 MED ORDER — PROMETHAZINE HCL 25 MG/ML IJ SOLN
12.5000 mg | Freq: Four times a day (QID) | INTRAMUSCULAR | Status: DC | PRN
  Administered 2016-10-19: 12.5 mg via INTRAVENOUS
  Filled 2016-10-19: qty 1

## 2016-10-19 NOTE — Discharge Summary (Signed)
Physician Discharge Summary  Patient ID: Jane Sherman MRN: [id number] DOB/AGE: 1976/09/10 40 y.o.  Admit date: 10/18/2016 Discharge date: 10/19/2016  Admission Diagnoses: Ectopic Pregnancy  Discharge Diagnoses:  Active Problems:   Ectopic pregnancy   Discharged Condition: good  Hospital Course: Patient seen in MAU for heavy bleeding and lower abdominal pain. Ultrasound was concerning for ruptured ectopic pregnancy. The patient was taken to OR for laparoscopy - no clear ectopic seen. Patient given methotrexate on Sunday night and observed. Labs stable. Previous area of concerns on ultrasound was resolved on repeat US. F/u on Wednesday in the office for repeat HCG.  Consults: None  Significant Diagnostic Studies: Ultrasound on 11/19 suspicious for ruptured ectopic. Repeat US on 11/20 shows improvement.  Treatments: surgery: laparoscopy  Discharge Exam: Blood pressure 110/63, pulse 81, temperature 99 F (37.2 C), temperature source Oral, resp. rate 18, height 5\' 5"  (1.651 m), weight 185 lb (83.9 kg), SpO2 99 %, not currently breastfeeding. Exam unchanged from this morning.  Disposition: 01-Home or Self Care     Medication List    TAKE these medications   HYDROcodone-acetaminophen 5-325 MG tablet Commonly known as:  NORCO/VICODIN Take 1-2 tablets by mouth every 4 (four) hours as needed for moderate pain.      Follow-up Information    [facility]   Specialty:  Obstetrics and Gynecology Contact information: [address] [place] [phone] [phone]         Greater than 30 minutes spent on the discharge of this patient, including coordiation of care and answering questions.  SignedCandelaria Celeste: STINSON, JACOB JEHIEL 10/19/2016, 4:02 PM

## 2016-10-19 NOTE — Progress Notes (Signed)
Pt ambulated out teachng complete  With family pt aware of follow up labs  wednesday

## 2016-10-19 NOTE — Addendum Note (Signed)
Addendum  created 10/19/16 1023 by Rica RecordsAngela Rajeev Escue, CRNA   Sign clinical note

## 2016-10-19 NOTE — Progress Notes (Signed)
Patient ID: Jane Sherman, female   DOB: 1976-01-10, 40 y.o.   MRN: [id number]   Subjective: Interval History:POD 1 s/p dx scope for presumed ectopic and no findings. S/p MTX for Repeat BHCG today and pelvic sono--pending  Objective: Vital signs in last 24 hours: Temp:  [97.1 F (36.2 C)-98.9 F (37.2 C)] 98.9 F (37.2 C) (11/20 0627) Pulse Rate:  [77-120] 85 (11/20 0627) Resp:  [13-24] 18 (11/20 0627) BP: (100-124)/(56-87) 100/56 (11/20 0627) SpO2:  [97 %-100 %] 100 % (11/20 0627) Weight:  [185 lb (83.9 kg)] 185 lb (83.9 kg) (11/19 1352)  Intake/Output from previous day: 11/19 0701 - 11/20 0700 In: 2954.2 [I.V.:2954.2] Out: 1600 [Urine:1450] Intake/Output this shift: No intake/output data recorded.  BP (!) 100/56 (BP Location: Left Arm)   Pulse 85   Temp 98.9 F (37.2 C) (Oral)   Resp 18   Ht 5\' 5"  (1.651 m)   Wt 185 lb (83.9 kg)   SpO2 100%   BMI 30.79 kg/m  General: WD/WN NAD Lungs: normal effort Abdomen: appropriately tender, Incision are clean and dry Extremities: Homans sign is negative, no sign of DVT  Results for orders placed or performed during the hospital encounter of 10/18/16 (from the past 24 hour(s))  Pregnancy, urine POC     Status: Abnormal   Collection Time: 10/18/16  2:21 PM  Result Value Ref Range   Preg Test, Ur POSITIVE (A) NEGATIVE  CBC with Differential/Platelet     Status: Abnormal   Collection Time: 10/18/16  2:45 PM  Result Value Ref Range   WBC 12.0 (H) 4.0 - 10.5 K/uL   RBC 4.30 3.87 - 5.11 MIL/uL   Hemoglobin 13.5 12.0 - 15.0 g/dL   HCT 40.939.4 81.136.0 - 91.446.0 %   MCV 91.6 78.0 - 100.0 fL   MCH 31.4 26.0 - 34.0 pg   MCHC 34.3 30.0 - 36.0 g/dL   RDW 78.213.1 95.611.5 - 21.315.5 %   Platelets 263 150 - 400 K/uL   Neutrophils Relative % 77 %   Neutro Abs 9.2 (H) 1.7 - 7.7 K/uL   Lymphocytes Relative 18 %   Lymphs Abs 2.2 0.7 - 4.0 K/uL   Monocytes Relative 4 %   Monocytes Absolute 0.4 0.1 - 1.0 K/uL   Eosinophils Relative 1 %   Eosinophils  Absolute 0.1 0.0 - 0.7 K/uL   Basophils Relative 0 %   Basophils Absolute 0.0 0.0 - 0.1 K/uL  Type and screen [facility]     Status: None   Collection Time: 10/18/16  2:45 PM  Result Value Ref Range   ABO/RH(D) O POS    Antibody Screen NEG    Sample Expiration 10/21/2016   hCG, quantitative, pregnancy     Status: Abnormal   Collection Time: 10/18/16  3:05 PM  Result Value Ref Range   hCG, Beta Chain, Quant, S 157 (H) <5 mIU/mL  Comprehensive metabolic panel     Status: Abnormal   Collection Time: 10/18/16  7:28 PM  Result Value Ref Range   Sodium 136 135 - 145 mmol/L   Potassium 4.6 3.5 - 5.1 mmol/L   Chloride 110 101 - 111 mmol/L   CO2 23 22 - 32 mmol/L   Glucose, Bld 113 (H) 65 - 99 mg/dL   BUN 8 6 - 20 mg/dL   Creatinine, Ser 0.860.66 0.44 - 1.00 mg/dL   Calcium 8.9 8.9 - 57.810.3 mg/dL   Total Protein 6.4 (L) 6.5 - 8.1 g/dL   Albumin  3.8 3.5 - 5.0 g/dL   AST 21 15 - 41 U/L   ALT 28 14 - 54 U/L   Alkaline Phosphatase 82 38 - 126 U/L   Total Bilirubin 1.8 (H) 0.3 - 1.2 mg/dL   GFR calc non Af Amer >60 >60 mL/min   GFR calc Af Amer >60 >60 mL/min   Anion gap 3 (L) 5 - 15  hCG, quantitative, pregnancy     Status: Abnormal   Collection Time: 10/18/16  7:28 PM  Result Value Ref Range   hCG, Beta Chain, Quant, S 137 (H) <5 mIU/mL  CBC     Status: Abnormal   Collection Time: 10/18/16  7:28 PM  Result Value Ref Range   WBC 15.7 (H) 4.0 - 10.5 K/uL   RBC 3.86 (L) 3.87 - 5.11 MIL/uL   Hemoglobin 12.1 12.0 - 15.0 g/dL   HCT 16.135.6 (L) 09.636.0 - 04.546.0 %   MCV 92.2 78.0 - 100.0 fL   MCH 31.3 26.0 - 34.0 pg   MCHC 34.0 30.0 - 36.0 g/dL   RDW 40.913.2 81.111.5 - 91.415.5 %   Platelets 246 150 - 400 K/uL  CBC     Status: Abnormal   Collection Time: 10/19/16  7:20 AM  Result Value Ref Range   WBC 10.7 (H) 4.0 - 10.5 K/uL   RBC 3.68 (L) 3.87 - 5.11 MIL/uL   Hemoglobin 11.5 (L) 12.0 - 15.0 g/dL   HCT 78.233.7 (L) 95.636.0 - 21.346.0 %   MCV 91.6 78.0 - 100.0 fL   MCH 31.3 26.0 - 34.0 pg    MCHC 34.1 30.0 - 36.0 g/dL   RDW 08.613.3 57.811.5 - 46.915.5 %   Platelets 251 150 - 400 K/uL    Studies/Results: [place] Ob Comp Less 14 Wks  Result Date: 10/18/2016 CLINICAL DATA:  Vaginal bleeding EXAM: OBSTETRIC <14 WK ULTRASOUND TECHNIQUE: Transabdominal ultrasound was performed for evaluation of the gestation as well as the maternal uterus and adnexal regions. COMPARISON:  None. FINDINGS: Intrauterine gestational sac: Not visualized Yolk sac:  Not visualized Embryo:  Not visualized Cardiac Activity: Not visualized Subchorionic hemorrhage:  None visualized. Maternal uterus/adnexae: The uterus has an inhomogeneous echotexture which may represent leiomyomatous change. The endometrium is thickened with probable hemorrhage within the endometrium. Right ovary appears unremarkable. A normal left ovary is not seen. There is complex echogenic material in the region of the left adnexum with moderate adjacent free fluid. IMPRESSION: No intrauterine gestation is appreciable. There is complex echogenic material in the left adnexum measuring 4.7 x 2.1 x 3.0 cm with moderate nearby fluid. This finding raises concern for potential ruptured ectopic gestation. Right ovary appears normal. The uterus appears inhomogeneous, suggesting leiomyomatous change. Hemorrhage is noted within the endometrium. Critical Value/emergent results were called by telephone at the time of interpretation on 10/18/2016 at 3:39 pm to Dr. Elsie LincolnKelly Leggett, OB/GYN, who verbally acknowledged these results. Electronically Signed   By: Bretta BangWilliam  Woodruff III M.D.   On: 10/18/2016 15:39   [place] Ob Transvaginal  Result Date: 10/18/2016 CLINICAL DATA:  Vaginal bleeding EXAM: OBSTETRIC <14 WK ULTRASOUND TECHNIQUE: Transabdominal ultrasound was performed for evaluation of the gestation as well as the maternal uterus and adnexal regions. COMPARISON:  None. FINDINGS: Intrauterine gestational sac: Not visualized Yolk sac:  Not visualized Embryo:  Not visualized Cardiac  Activity: Not visualized Subchorionic hemorrhage:  None visualized. Maternal uterus/adnexae: The uterus has an inhomogeneous echotexture which may represent leiomyomatous change. The endometrium is thickened with probable hemorrhage within the endometrium.  Right ovary appears unremarkable. A normal left ovary is not seen. There is complex echogenic material in the region of the left adnexum with moderate adjacent free fluid. IMPRESSION: No intrauterine gestation is appreciable. There is complex echogenic material in the left adnexum measuring 4.7 x 2.1 x 3.0 cm with moderate nearby fluid. This finding raises concern for potential ruptured ectopic gestation. Right ovary appears normal. The uterus appears inhomogeneous, suggesting leiomyomatous change. Hemorrhage is noted within the endometrium. Critical Value/emergent results were called by telephone at the time of interpretation on 10/18/2016 at 3:39 pm to Dr. Elsie Lincoln, OB/GYN, who verbally acknowledged these results. Electronically Signed   By: Bretta Bang III M.D.   On: 10/18/2016 15:39    Scheduled Meds: Continuous Infusions: . lactated ringers 125 mL/hr at 10/19/16 0358  . lactated ringers 125 mL/hr at 10/18/16 1912   PRN Meds:HYDROcodone-acetaminophen, ondansetron **OR** ondansetron (ZOFRAN) IV, promethazine, simethicone, zolpidem  Assessment/Plan: Active Problems:   Ectopic pregnancy, tubal  Repeat BHCG, CBC pelvic sono this am--consider d/c after that with outpatient f/u. Advance diet   LOS: 0 days   Reva Bores, MD 10/19/2016 8:00 AM

## 2016-10-19 NOTE — Discharge Instructions (Signed)
Methotrexate Treatment for an Ectopic Pregnancy, Care After °Refer to this sheet in the next few weeks. These instructions provide you with information on caring for yourself after your procedure. Your health care provider may also give you more specific instructions. Your treatment has been planned according to current medical practices, but problems sometimes occur. Call your health care provider if you have any problems or questions after your procedure. °WHAT TO EXPECT AFTER THE PROCEDURE °You may have some abdominal cramping, vaginal bleeding, and fatigue in the first few days after taking methotrexate. Some other possible side effects of methotrexate include: °· Nausea. °· Vomiting. °· Diarrhea. °· Mouth sores. °· Swelling or irritation of the lining of your lungs (pneumonitis). °· Liver damage. °· Hair loss. °HOME CARE INSTRUCTIONS  °After you have received the methotrexate medicine, you need to be careful of your activities and watch your condition for several weeks. It may take 1 week before your hormone levels return to normal. °· Keep all follow-up appointments as directed by your health care provider. °· Avoid traveling too far away from your health care provider. °· Do not have sexual intercourse until your health care provider says it is safe to do so. °· You may resume your usual diet. °· Limit strenuous activity. °· Do not take folic acid, prenatal vitamins, or other vitamins that contain folic acid. °· Do not take aspirin, ibuprofen, or naproxen (nonsteroidal anti-inflammatory drugs [NSAIDs]). °· Do not drink alcohol. °SEEK MEDICAL CARE IF:  °· You cannot control your nausea and vomiting. °· You cannot control your diarrhea. °· You have sores in your mouth and want treatment. °· You need pain medicine for your abdominal pain. °· You have a rash. °· You are having a reaction to the medicine. °SEEK IMMEDIATE MEDICAL CARE IF:  °· You have increasing abdominal or pelvic pain. °· You notice increased  bleeding. °· You feel light-headed, or you faint. °· You have shortness of breath. °· Your heart rate increases. °· You have a cough. °· You have chills. °· You have a fever. °This information is not intended to replace advice given to you by your health care provider. Make sure you discuss any questions you have with your health care provider. °Document Released: 11/05/2011 Document Revised: 11/21/2013 Document Reviewed: 09/04/2013 °Elsevier Interactive Patient Education © 2017 Elsevier Inc. ° °

## 2016-10-19 NOTE — Anesthesia Postprocedure Evaluation (Signed)
Anesthesia Post Note  Patient: Jane SensingIngrid Sherman  Procedure(s) Performed: Procedure(s) (LRB): DIAGNOSTIC LAPAROSCOPY WITH Removal Peritoneal Contents, Lysis of Adhesions, Peritoneal Lavage (N/A)  Patient location during evaluation: [facility] Anesthesia Type: General Level of consciousness: awake and alert Pain management: pain level controlled Vital Signs Assessment: post-procedure vital signs reviewed and stable Respiratory status: spontaneous breathing, nonlabored ventilation, respiratory function stable and patient connected to nasal cannula oxygen Cardiovascular status: blood pressure returned to baseline and stable Postop Assessment: adequate PO intake and no signs of nausea or vomiting Anesthetic complications: no     Last Vitals:  Vitals:   10/19/16 0627 10/19/16 1010  BP: (!) 100/56 110/63  Pulse: 85 81  Resp: 18 18  Temp: 37.2 C 37.2 C    Last Pain:  Vitals:   10/19/16 1010  TempSrc: Oral  PainSc:    Pain Goal: Patients Stated Pain Goal: 3 (10/19/16 0720)               [name],[name]

## 2016-10-21 ENCOUNTER — Telehealth: Payer: Self-pay | Admitting: General Practice

## 2016-10-21 ENCOUNTER — Other Ambulatory Visit: Payer: Self-pay | Admitting: General Practice

## 2016-10-21 ENCOUNTER — Other Ambulatory Visit (HOSPITAL_COMMUNITY)
Admission: AD | Admit: 2016-10-21 | Discharge: 2016-10-21 | Disposition: A | Discharge status: Home / Self Care | Payer: Self-pay | Source: Ambulatory Visit | Attending: Obstetrics & Gynecology | Admitting: Obstetrics & Gynecology

## 2016-10-21 DIAGNOSIS — O009 Unspecified ectopic pregnancy without intrauterine pregnancy: Secondary | ICD-10-CM

## 2016-10-21 LAB — HCG, QUANTITATIVE, PREGNANCY: HCG, BETA CHAIN, QUANT, S: 119 m[IU]/mL — AB (ref <5)

## 2016-10-21 NOTE — Telephone Encounter (Signed)
Called patient & informed her of bhcg results & recommended follow up on 11/25 in [facility]U. Patient verbalized understanding and had no questions

## 2016-10-21 NOTE — Progress Notes (Signed)
Patient here today for stat bhcg for day 4 labs from MTX. Patient denies pain only having spotting. Spoke with Dr Jolayne Pantheronstant about patient who agrees patient does not need to wait for results/be seen at this time- can call patient with results. Patient informed and provided callback number [phone]. Spoke with Dr Omer JackMumaw & Dr Erin FullingHarraway Smith who recommended patient follow up in [facility]U on 11/25 for day 7 labs. Will call patient with results

## 2016-10-24 ENCOUNTER — Inpatient Hospital Stay (HOSPITAL_COMMUNITY)
Admission: AD | Admit: 2016-10-24 | Discharge: 2016-10-24 | Disposition: A | Discharge status: Home / Self Care | Payer: Medicaid Other | Source: Ambulatory Visit | Attending: Obstetrics and Gynecology | Admitting: Obstetrics and Gynecology

## 2016-10-24 DIAGNOSIS — O009 Unspecified ectopic pregnancy without intrauterine pregnancy: Secondary | ICD-10-CM | POA: Insufficient documentation

## 2016-10-24 DIAGNOSIS — Z09 Encounter for follow-up examination after completed treatment for conditions other than malignant neoplasm: Secondary | ICD-10-CM | POA: Insufficient documentation

## 2016-10-24 LAB — HCG, QUANTITATIVE, PREGNANCY: hCG, Beta Chain, Quant, S: 104 m[IU]/mL — ABNORMAL HIGH (ref <5)

## 2016-10-24 NOTE — MAU Note (Addendum)
Day 7 post MTX.  Still having some bleeding, but not that much.  Little cramping. Asking about belly button, what to expect with healing.

## 2016-10-24 NOTE — Discharge Instructions (Signed)
Methotrexate Treatment for an Ectopic Pregnancy, Care After °Refer to this sheet in the next few weeks. These instructions provide you with information on caring for yourself after your procedure. Your health care provider may also give you more specific instructions. Your treatment has been planned according to current medical practices, but problems sometimes occur. Call your health care provider if you have any problems or questions after your procedure. °WHAT TO EXPECT AFTER THE PROCEDURE °You may have some abdominal cramping, vaginal bleeding, and fatigue in the first few days after taking methotrexate. Some other possible side effects of methotrexate include: °· Nausea. °· Vomiting. °· Diarrhea. °· Mouth sores. °· Swelling or irritation of the lining of your lungs (pneumonitis). °· Liver damage. °· Hair loss. °HOME CARE INSTRUCTIONS  °After you have received the methotrexate medicine, you need to be careful of your activities and watch your condition for several weeks. It may take 1 week before your hormone levels return to normal. °· Keep all follow-up appointments as directed by your health care provider. °· Avoid traveling too far away from your health care provider. °· Do not have sexual intercourse until your health care provider says it is safe to do so. °· You may resume your usual diet. °· Limit strenuous activity. °· Do not take folic acid, prenatal vitamins, or other vitamins that contain folic acid. °· Do not take aspirin, ibuprofen, or naproxen (nonsteroidal anti-inflammatory drugs [NSAIDs]). °· Do not drink alcohol. °SEEK MEDICAL CARE IF:  °· You cannot control your nausea and vomiting. °· You cannot control your diarrhea. °· You have sores in your mouth and want treatment. °· You need pain medicine for your abdominal pain. °· You have a rash. °· You are having a reaction to the medicine. °SEEK IMMEDIATE MEDICAL CARE IF:  °· You have increasing abdominal or pelvic pain. °· You notice increased  bleeding. °· You feel light-headed, or you faint. °· You have shortness of breath. °· Your heart rate increases. °· You have a cough. °· You have chills. °· You have a fever. °This information is not intended to replace advice given to you by your health care provider. Make sure you discuss any questions you have with your health care provider. °Document Released: 11/05/2011 Document Revised: 11/21/2013 Document Reviewed: 09/04/2013 °Elsevier Interactive Patient Education © 2017 Elsevier Inc. ° °

## 2016-10-24 NOTE — MAU Provider Note (Signed)
History   [id number]   Chief Complaint  Patient presents with  . Labs Only    follow up bhcg day 7 s/p mtx    HPI Jane Sherman is a 40 y.o. female [id number] here for follow-up BHCG. This is day 7 BHCG following methotrexate for ectopic pregnancy. Patient had diagnostic laparoscopy for ectopic pregnancy on 11/19; pregnancy not found. Was given methotrexate that night. Reports mild lower abdominal cramping & some pink spotting.   No LMP recorded. Patient is pregnant.  OB History  Gravida Para Term Preterm AB Living  5 2 2  0 2 2  SAB TAB Ectopic Multiple Live Births  2 0 0 0 2    # Outcome Date GA Lbr Len/2nd Weight Sex Delivery Anes PTL Lv  5 Current           4 Term 03/12/15 8620w6d 05:40 / 02:31 9 lb 12.6 oz (4.44 kg) F CS-LTranv EPI  LIV  3 SAB 04/30/12 6829w0d         2 SAB 06/30/10 6173w0d         1 Term 10/12/01 352w2d  8 lb 9 oz (3.884 kg) F Vag-Spont  N LIV      Past Medical History:  Diagnosis Date  . Abnormal Pap smear of cervix   . Contraceptive management 01/12/2014  . Gestational diabetes    with previous pregnancy  . Herpes 04/17/2014  . HPV (human papilloma virus) infection   . Labial irritation 01/12/2014   ?herpes  . Pelvic pain in pregnant patient at less than [redacted] weeks gestation 07/04/2014  . Pregnant state, incidental 07/04/2014    Family History  Problem Relation Age of Onset  . Diabetes Mother   . Diabetes Maternal Grandmother     Social History   Social History  . Marital status: Married    Spouse name: N/A  . Number of children: N/A  . Years of education: N/A   Social History Main Topics  . Smoking status: Former Games developermoker  . Smokeless tobacco: Never Used  . Alcohol use No     Comment: social  . Drug use: No  . Sexual activity: Yes    Birth control/ protection: None   Other Topics Concern  . Not on file   Social History Narrative  . No narrative on file    Allergies  Allergen Reactions  . Latex Rash    No current  facility-administered medications on file prior to encounter.    Current Outpatient Prescriptions on File Prior to Encounter  Medication Sig Dispense Refill  . HYDROcodone-acetaminophen (NORCO/VICODIN) 5-325 MG tablet Take 1-2 tablets by mouth every 4 (four) hours as needed for moderate pain. 20 tablet 0     Physical Exam   Vitals:   10/24/16 1231  BP: 107/76  Pulse: 77  Resp: 16  Temp: 98.3 F (36.8 C)  TempSrc: Oral    Physical Exam  Nursing note and vitals reviewed. Constitutional: She is oriented to person, place, and time. She appears well-developed and well-nourished. No distress.  HENT:  Head: Normocephalic and atraumatic.  Eyes: Conjunctivae are normal. Right eye exhibits no discharge. Left eye exhibits no discharge. No scleral icterus.  Neck: Normal range of motion.  Respiratory: Effort normal. No respiratory distress.  GI: Soft. There is no tenderness.  Neurological: She is alert and oriented to person, place, and time.  Skin: Skin is warm and dry. She is not diaphoretic.  Psychiatric: She has a normal mood and affect. Her behavior is  normal. Judgment and thought content normal.    MAU Course  Procedures Results for orders placed or performed during the hospital encounter of 10/24/16 (from the past 24 hour(s))  hCG, quantitative, pregnancy     Status: Abnormal   Collection Time: 10/24/16 11:43 AM  Result Value Ref Range   hCG, Beta Chain, Quant, S 104 (H) <5 mIU/mL   Component     Latest Ref Rng & Units 10/18/2016 10/19/2016 10/21/2016 10/24/2016         7:28 PM     HCG, Beta Chain, Quant, S     <5 mIU/mL 137 (H) 132 (H) 119 (H) 104 (H)    MDM >10% drop in bhcg from day 1 labs Reviewed labs with Dr. Emelda FearFerguson. Ok to discharge home & start weekly BHCG checks down to 0  Assessment and Plan  40 y.o. [id number] at Unknown wks Pregnancy Follow-up BHCG 1. Ectopic pregnancy without intrauterine pregnancy, unspecified location    P: [facility]charge home [facility] Discussed reasons to return to [facility]U   Judeth HornErin Kynslei Art, NP 10/24/2016 12:45 PM

## 2016-10-30 ENCOUNTER — Ambulatory Visit: Payer: Self-pay | Admitting: General Practice

## 2016-10-30 DIAGNOSIS — O009 Unspecified ectopic pregnancy without intrauterine pregnancy: Secondary | ICD-10-CM

## 2016-10-30 NOTE — Progress Notes (Signed)
Patient here for bhcg follow up today. Reviewed patient's chart with Dr Debroah LoopArnold who agrees lab does not need to be stat today. Patient should see a provider for follow up. Patient reports continued spotting. No pain. Patient informed of repeat lab today and follow up with a provider.

## 2016-10-31 LAB — HCG, QUANTITATIVE, PREGNANCY: HCG, BETA CHAIN, QUANT, S: 44.6 m[IU]/mL — AB

## 2016-11-02 ENCOUNTER — Telehealth: Payer: Self-pay

## 2016-11-02 NOTE — Telephone Encounter (Signed)
Spoke with patient concerning  Her quant level. Patient stated she is doing ok. She reports having some mild bleeding at this time but no pain. Patient has been scheduled for another quant check on 11/06/2016. Patient express interest in starting birth control. I advised patient she would need to discuss this with a provider. Appointment will be made for her on 11/06/2016. Patient voice understanding.

## 2016-11-06 ENCOUNTER — Other Ambulatory Visit: Payer: Self-pay

## 2016-11-09 ENCOUNTER — Other Ambulatory Visit: Payer: Self-pay

## 2016-11-09 DIAGNOSIS — O039 Complete or unspecified spontaneous abortion without complication: Secondary | ICD-10-CM

## 2016-11-10 LAB — HCG, QUANTITATIVE, PREGNANCY: hCG, Beta Chain, Quant, S: 22.7 m[IU]/mL — ABNORMAL HIGH

## 2016-11-11 ENCOUNTER — Ambulatory Visit (INDEPENDENT_AMBULATORY_CARE_PROVIDER_SITE_OTHER): Payer: Medicaid Other | Admitting: Advanced Practice Midwife

## 2016-11-11 ENCOUNTER — Encounter: Payer: Self-pay | Admitting: Advanced Practice Midwife

## 2016-11-11 ENCOUNTER — Other Ambulatory Visit (HOSPITAL_COMMUNITY)
Admission: RE | Admit: 2016-11-11 | Discharge: 2016-11-11 | Disposition: A | Discharge status: Home / Self Care | Payer: Medicaid Other | Source: Ambulatory Visit | Attending: Advanced Practice Midwife | Admitting: Advanced Practice Midwife

## 2016-11-11 ENCOUNTER — Telehealth: Payer: Self-pay | Admitting: General Practice

## 2016-11-11 VITALS — BP 144/88 | HR 66 | Ht 63.5 in | Wt 184.0 lb

## 2016-11-11 DIAGNOSIS — Z01419 Encounter for gynecological examination (general) (routine) without abnormal findings: Secondary | ICD-10-CM

## 2016-11-11 DIAGNOSIS — Z0000 Encounter for general adult medical examination without abnormal findings: Secondary | ICD-10-CM

## 2016-11-11 DIAGNOSIS — O00109 Unspecified tubal pregnancy without intrauterine pregnancy: Secondary | ICD-10-CM

## 2016-11-11 DIAGNOSIS — Z1151 Encounter for screening for human papillomavirus (HPV): Secondary | ICD-10-CM | POA: Insufficient documentation

## 2016-11-11 NOTE — Telephone Encounter (Signed)
Patient called and left message requesting results. Called patient and informed her of results and to have one of her providers follow up on those levels when she goes in for her appt today. Patent verbalized understanding and had no questions

## 2016-11-11 NOTE — Progress Notes (Signed)
Jane Sherman 40 y.o.  Vitals:   11/11/16 1436  BP: (!) 144/88  Pulse: 66     Filed Weights   11/11/16 1436  Weight: 184 lb (83.5 kg)    Past Medical History: Past Medical History:  Diagnosis Date  . Abnormal Pap smear of cervix   . Contraceptive management 01/12/2014  . Gestational diabetes    with previous pregnancy  . Herpes 04/17/2014  . HPV (human papilloma virus) infection   . Labial irritation 01/12/2014   ?herpes  . Pelvic pain in pregnant patient at less than [redacted] weeks gestation 07/04/2014  . Pregnant state, incidental 07/04/2014    Past Surgical History: Past Surgical History:  Procedure Laterality Date  . CERVICAL BIOPSY    . CESAREAN SECTION N/A 03/12/2015   Procedure: CESAREAN SECTION;  Surgeon: Levie HeritageJacob J Stinson, DO;  Location: [facility];  Service: Obstetrics;  Laterality: N/A;  . COLPOSCOPY  09/2013  . DIAGNOSTIC LAPAROSCOPY WITH REMOVAL OF ECTOPIC PREGNANCY N/A 10/18/2016   Procedure: DIAGNOSTIC LAPAROSCOPY WITH Removal Peritoneal Contents, Lysis of Adhesions, Peritoneal Lavage;  Surgeon: Lesly DukesKelly H Leggett, MD;  Location: [facility];  Service: Gynecology;  Laterality: N/A;  . TONSILLECTOMY      Family History: Family History  Problem Relation Age of Onset  . Diabetes Mother   . Diabetes Maternal Grandmother     Social History: Social History  Substance Use Topics  . Smoking status: Former Games developermoker  . Smokeless tobacco: Never Used  . Alcohol use No     Comment: social    Allergies:  Allergies  Allergen Reactions  . Latex Rash      Current Outpatient Prescriptions:  .  valACYclovir (VALTREX) 1000 MG tablet, Take 1,000 mg by mouth as needed., Disp: , Rfl:  .  HYDROcodone-acetaminophen (NORCO/VICODIN) 5-325 MG tablet, Take 1-2 tablets by mouth every 4 (four) hours as needed for moderate pain. (Patient not taking: Reported on 11/11/2016), Disp: 20 tablet, Rfl: 0  History of PresentHIllness: Here for pap.  Last pa 11/14, normal. Had ectopic pregnancy  w/surgery 11/27.  No pregnancy found, so given MTX.  Last qhcg 22 on 12/11.    Review of Systems   Patient denies any headaches, blurred vision, shortness of breath, chest pain, abdominal pain, problems with bowel movements, urination, or intercourse.   Physical Exam: General:  Well developed, well nourished, no acute distress Skin:  Warm and dry Neck:  Midline trachea, normal thyroid Lungs; Clear to auscultation bilaterally Breast:  No dominant palpable mass, retraction, or nipple discharge Cardiovascular: Regular rate and rhythm Abdomen:  Soft, non tender, no hepatosplenomegaly.  Laproscopic wounds healing well.  Pelvic:  External genitalia is normal in appearance.  The vagina is normal in appearance.  The cervix is bulbous.  Uterus is felt to be normal size, shape, and contour.  No adnexal masses or tenderness noted.  Extremities:  No swelling or varicosities noted Psych:  No mood changes.     Impression: S/P ectopic, appropriately dropping HCG     Plan: (Hopefully) last HCG 12/22 Nexplanon 12/27 as scheduled.

## 2016-11-13 ENCOUNTER — Other Ambulatory Visit: Payer: Self-pay | Admitting: Adult Health

## 2016-11-13 LAB — CYTOLOGY - PAP
DIAGNOSIS: NEGATIVE
HPV (WINDOPATH): NOT DETECTED

## 2016-11-25 ENCOUNTER — Encounter: Payer: Medicaid Other | Admitting: Advanced Practice Midwife

## 2016-12-01 ENCOUNTER — Other Ambulatory Visit: Payer: Medicaid Other

## 2016-12-01 DIAGNOSIS — O009 Unspecified ectopic pregnancy without intrauterine pregnancy: Secondary | ICD-10-CM

## 2016-12-02 ENCOUNTER — Ambulatory Visit (INDEPENDENT_AMBULATORY_CARE_PROVIDER_SITE_OTHER): Payer: Medicaid Other | Admitting: Advanced Practice Midwife

## 2016-12-02 ENCOUNTER — Encounter: Payer: Self-pay | Admitting: Advanced Practice Midwife

## 2016-12-02 VITALS — BP 116/66 | HR 74 | Wt 191.3 lb

## 2016-12-02 DIAGNOSIS — Z3202 Encounter for pregnancy test, result negative: Secondary | ICD-10-CM

## 2016-12-02 DIAGNOSIS — Z30017 Encounter for initial prescription of implantable subdermal contraceptive: Secondary | ICD-10-CM | POA: Diagnosis not present

## 2016-12-02 LAB — POCT URINE PREGNANCY: PREG TEST UR: NEGATIVE

## 2016-12-02 LAB — HCG, QUANTITATIVE, PREGNANCY

## 2016-12-02 NOTE — Progress Notes (Signed)
  HPI:  Danella Sensingngrid Broughton is a 41 y.o. year old  female here for Nexplanon insertion.  She is S/P SAB , and her pregnancy test today was negative.  Risks/benefits/side effects of Nexplanon have been discussed and her questions have been answered.  Specifically, a failure rate of 11/998 has been reported, with an increased failure rate if pt takes St. John's Wort and/or antiseizure medicaitons.  Danella Sensingngrid Fugitt is aware of the common side effect of irregular bleeding, which the incidence of decreases over time.   Past Medical History: Past Medical History:  Diagnosis Date  . Abnormal Pap smear of cervix   . Contraceptive management 01/12/2014  . Gestational diabetes    with previous pregnancy  . Herpes 04/17/2014  . HPV (human papilloma virus) infection   . Labial irritation 01/12/2014   ?herpes  . Pelvic pain in pregnant patient at less than [redacted] weeks gestation 07/04/2014  . Pregnant state, incidental 07/04/2014    Past Surgical History: Past Surgical History:  Procedure Laterality Date  . CERVICAL BIOPSY    . CESAREAN SECTION N/A 03/12/2015   Procedure: CESAREAN SECTION;  Surgeon: Levie HeritageJacob J Stinson, DO;  Location: [facility];  Service: Obstetrics;  Laterality: N/A;  . COLPOSCOPY  09/2013  . DIAGNOSTIC LAPAROSCOPY WITH REMOVAL OF ECTOPIC PREGNANCY N/A 10/18/2016   Procedure: DIAGNOSTIC LAPAROSCOPY WITH Removal Peritoneal Contents, Lysis of Adhesions, Peritoneal Lavage;  Surgeon: Lesly DukesKelly H Leggett, MD;  Location: [facility];  Service: Gynecology;  Laterality: N/A;  . TONSILLECTOMY      Family History: Family History  Problem Relation Age of Onset  . Diabetes Mother   . Diabetes Maternal Grandmother     Social History: Social History  Substance Use Topics  . Smoking status: Former Games developermoker  . Smokeless tobacco: Never Used  . Alcohol use No     Comment: social    Allergies:  Allergies  Allergen Reactions  . Latex Rash      Her left arm, approximatly 4 inches proximal from the  elbow, was cleansed with alcohol and anesthetized with 2cc of 2% Lidocaine.  The area was cleansed again and the Nexplanon was inserted without difficulty.  A pressure bandage was applied.  Pt was instructed to remove pressure bandage in a few hours, and keep insertion site covered with a bandaid for 3 days.  Back up contraception was recommended for 2 weeks.  Follow-up scheduled PRN problems  CRESENZO-DISHMAN,Ayriana Wix 12/02/2016 2:10 PM

## 2017-05-14 ENCOUNTER — Encounter: Payer: Self-pay | Admitting: Adult Health

## 2017-05-14 ENCOUNTER — Ambulatory Visit (INDEPENDENT_AMBULATORY_CARE_PROVIDER_SITE_OTHER): Payer: Medicaid Other | Admitting: Adult Health

## 2017-05-14 VITALS — BP 108/72 | HR 74 | Ht 65.0 in | Wt 180.5 lb

## 2017-05-14 DIAGNOSIS — F32A Depression, unspecified: Secondary | ICD-10-CM | POA: Insufficient documentation

## 2017-05-14 DIAGNOSIS — Z3046 Encounter for surveillance of implantable subdermal contraceptive: Secondary | ICD-10-CM

## 2017-05-14 DIAGNOSIS — Z3049 Encounter for surveillance of other contraceptives: Secondary | ICD-10-CM

## 2017-05-14 DIAGNOSIS — F329 Major depressive disorder, single episode, unspecified: Secondary | ICD-10-CM | POA: Insufficient documentation

## 2017-05-14 MED ORDER — ESCITALOPRAM OXALATE 10 MG PO TABS: 10.0000 mg | ORAL_TABLET | Freq: Every day | ORAL | 6 refills | Status: AC

## 2017-05-14 NOTE — Progress Notes (Signed)
Subjective:     Patient ID: Danella SensingIngrid Sigel, female   DOB: [birth year], 41 y.o.   MRN: [id number]  HPI Jerene Dillingngrid is a 41 year old Hispanic female, in for nexplanon removal.She is in process of getting divorced and is not sexually active.   Review of Systems  For nexplanon removal Depressed  Reviewed past medical,surgical, social and family history. Reviewed medications and allergies.     Objective:   Physical Exam BP 108/72 (BP Location: Left Arm, Patient Position: Sitting, Cuff Size: Normal)   Pulse 74   Ht 5\' 5"  (1.651 m)   Wt 180 lb 8 oz (81.9 kg)   Breastfeeding? No   BMI 30.04 kg/m Consent signed, time out called.   Left arm cleansed with betadine, and injected with 1.5 cc 2% lidocaine and waited til numb.Under sterile technique a #11 blade was used to make small vertical incision, and a curved forceps was used to easily remove rod. Steri strips applied. Pressure dressing applied. PHQ 9 score 14,denies bing suicidal, but is depressed and wants to try meds.  Assessment:     1. Encounter for Nexplanon removal   2.      Depression     Plan:     Use condoms, keep clean and dry x 24 hours, no heavy lifting, keep steri strips on x 72 hours, Keep pressure dressing on x 24 hours.     Meds ordered this encounter  Medications  . escitalopram (LEXAPRO) 10 MG tablet    Sig: Take 1 tablet (10 mg total) by mouth daily.    Dispense:  30 tablet    Refill:  6    Order Specific Question:   Supervising Provider    Answer:   Duane LopeEURE, LUTHER H [[id number]]  Follow up with me in 6 weeks

## 2017-06-25 ENCOUNTER — Ambulatory Visit: Payer: Medicaid Other | Admitting: Adult Health

## 2017-07-07 ENCOUNTER — Ambulatory Visit (INDEPENDENT_AMBULATORY_CARE_PROVIDER_SITE_OTHER): Payer: Self-pay | Admitting: Women's Health

## 2017-07-07 VITALS — BP 110/66 | HR 76 | Ht 65.0 in | Wt 179.0 lb

## 2017-07-07 DIAGNOSIS — F32A Depression, unspecified: Secondary | ICD-10-CM

## 2017-07-07 DIAGNOSIS — F329 Major depressive disorder, single episode, unspecified: Secondary | ICD-10-CM

## 2017-07-08 ENCOUNTER — Encounter: Payer: Self-pay | Admitting: Women's Health

## 2017-07-08 NOTE — Progress Notes (Signed)
Pts information and history reviewed and updated at her visit on 07/07/2017. Computer system was down so charting was done on 07/08/2017

## 2017-07-08 NOTE — Patient Instructions (Signed)
Major Depressive Disorder, Adult Major depressive disorder (MDD) is a mental health condition. It may also be called clinical depression or unipolar depression. MDD usually causes feelings of sadness, hopelessness, or helplessness. MDD can also cause physical symptoms. It can interfere with work, school, relationships, and other everyday activities. MDD may be mild, moderate, or severe. It may occur once (single episode major depressive disorder) or it may occur multiple times (recurrent major depressive disorder). What are the causes? The exact cause of this condition is not known. MDD is most likely caused by a combination of things, which may include:  Genetic factors. These are traits that are passed along from parent to child.  Individual factors. Your personality, your behavior, and the way you handle your thoughts and feelings may contribute to MDD. This includes personality traits and behaviors learned from others.  Physical factors, such as: ? Differences in the part of your brain that controls emotion. This part of your brain may be different than it is in people who do not have MDD. ? Long-term (chronic) medical or psychiatric illnesses.  Social factors. Traumatic experiences or major life changes may play a role in the development of MDD.  What increases the risk? This condition is more likely to develop in women. The following factors may also make you more likely to develop MDD:  A family history of depression.  Troubled family relationships.  Abnormally low levels of certain brain chemicals.  Traumatic events in childhood, especially abuse or the loss of a parent.  Being under a lot of stress, or long-term stress, especially from upsetting life experiences or losses.  A history of: ? Chronic physical illness. ? Other mental health disorders. ? Substance abuse.  Poor living conditions.  Experiencing social exclusion or discrimination on a regular basis.  What are  the signs or symptoms? The main symptoms of MDD typically include:  Constant depressed or irritable mood.  Loss of interest in things and activities.  MDD symptoms may also include:  Sleeping or eating too much or too little.  Unexplained weight change.  Fatigue or low energy.  Feelings of worthlessness or guilt.  Difficulty thinking clearly or making decisions.  Thoughts of suicide or of harming others.  Physical agitation or weakness.  Isolation.  Severe cases of MDD may also occur with other symptoms, such as:  Delusions or hallucinations, in which you imagine things that are not real (psychotic depression).  Low-level depression that lasts at least a year (chronic depression or persistent depressive disorder).  Extreme sadness and hopelessness (melancholic depression).  Trouble speaking and moving (catatonic depression).  How is this diagnosed? This condition may be diagnosed based on:  Your symptoms.  Your medical history, including your mental health history. This may involve tests to evaluate your mental health. You may be asked questions about your lifestyle, including any drug and alcohol use, and how long you have had symptoms of MDD.  A physical exam.  Blood tests to rule out other conditions.  You must have a depressed mood and at least four other MDD symptoms most of the day, nearly every day in the same 2-week timeframe before your health care provider can confirm a diagnosis of MDD. How is this treated? This condition is usually treated by mental health professionals, such as psychologists, psychiatrists, and clinical social workers. You may need more than one type of treatment. Treatment may include:  Psychotherapy. This is also called talk therapy or counseling. Types of psychotherapy include: ? Cognitive behavioral   therapy (CBT). This type of therapy teaches you to recognize unhealthy feelings, thoughts, and behaviors, and replace them with  positive thoughts and actions. ? Interpersonal therapy (IPT). This helps you to improve the way you relate to and communicate with others. ? Family therapy. This treatment includes members of your family.  Medicine to treat anxiety and depression, or to help you control certain emotions and behaviors.  Lifestyle changes, such as: ? Limiting alcohol and drug use. ? Exercising regularly. ? Getting plenty of sleep. ? Making healthy eating choices. ? Spending more time outdoors.  Treatments involving stimulation of the brain can be used in situations with extremely severe symptoms, or when medicine or other therapies do not work over time. These treatments include electroconvulsive therapy, transcranial magnetic stimulation, and vagal nerve stimulation. Follow these instructions at home: Activity  Return to your normal activities as told by your health care provider.  Exercise regularly and spend time outdoors as told by your health care provider. General instructions  Take over-the-counter and prescription medicines only as told by your health care provider.  Do not drink alcohol. If you drink alcohol, limit your alcohol intake to no more than 1 drink a day for nonpregnant women and 2 drinks a day for men. One drink equals 12 oz of beer, 5 oz of wine, or 1 oz of hard liquor. Alcohol can affect any antidepressant medicines you are taking. Talk to your health care provider about your alcohol use.  Eat a healthy diet and get plenty of sleep.  Find activities that you enjoy doing, and make time to do them.  Consider joining a support group. Your health care provider may be able to recommend a support group.  Keep all follow-up visits as told by your health care provider. This is important. Where to find more information: National Alliance on Mental Illness  www.nami.org  U.[place]. National Institute of Mental Health  www.nimh.nih.gov  National Suicide Prevention  Lifeline  1-800-273-TALK (8255). This is free, 24-hour help.  Contact a health care provider if:  Your symptoms get worse.  You develop new symptoms. Get help right away if:  You self-harm.  You have serious thoughts about hurting yourself or others.  You see, hear, taste, smell, or feel things that are not present (hallucinate). This information is not intended to replace advice given to you by your health care provider. Make sure you discuss any questions you have with your health care provider. Document Released: 03/13/2013 Document Revised: 07/23/2016 Document Reviewed: 05/27/2016 Elsevier Interactive Patient Education  2017 [employer]vier Inc.  

## 2017-07-08 NOTE — Progress Notes (Signed)
   [facility] Visit  Patient name: Jane Sherman MRN [id number]  Date of birth: [birth year]  CC & HPI:  Jane Sensingngrid Kalmar is a 41 y.o. [phone] Hispanic female presenting today for f/u on Lexapro 10mg  that was rx'd 05/14/17 at time of Nexplanon removal by JAG d/t depression. States she has not started taking it, b/c she felt OK at this time. Is going through divorce and feels the upcoming holidays may be very hard on her, so states she may start taking it then. Some mild trouble sleeping, no problems eating, still finds joy in things she used to. Denies SI. Declines counseling at this time. States her child is going through counseling in [place] and they offered to her as well, she is still thinking about it. Not currently sexually active.  No LMP recorded. Patient has had an implant. The current method of family planning is abstinence. Last pap 11/11/16 neg w/ -HRHPV  Pertinent History Reviewed:  Medical & Surgical Hx:   Past medical, surgical, family, and social history reviewed in electronic medical record Medications: Reviewed & Updated - see associated section Allergies: Reviewed in electronic medical record  Objective Findings:  Vitals: BP 110/66 (BP Location: Right Arm, Patient Position: Sitting, Cuff Size: Normal)   Pulse 76   Ht 5\' 5"  (1.651 m)   Wt 179 lb (81.2 kg)   BMI 29.79 kg/m  Body mass index is 29.79 kg/m.   PHQ9 (completed on paper)=5  Physical Examination: General appearance - alert, well appearing, and in no distress  No results found for this or any previous visit (from the past 24 hour(s)).   Assessment & Plan:  A:   Depression  F/U on meds  P:  If desires to begin Lexapro for upcoming holidays, start taking 3-4wks prior to holidays to get most benefit  Return for after 12/13, Physical.   **EPIC down entire encounter, documentation completed the next day**  Marge DuncansBooker, Kimberly Randall CNM, [facility]NP-BC 07/08/2017 9:34 AM

## 2017-09-25 ENCOUNTER — Emergency Department (HOSPITAL_COMMUNITY): Payer: Self-pay

## 2017-09-25 ENCOUNTER — Emergency Department (HOSPITAL_COMMUNITY)
Admission: EM | Admit: 2017-09-25 | Discharge: 2017-09-25 | Disposition: A | Discharge status: Home / Self Care | Payer: Self-pay | Attending: Emergency Medicine | Admitting: Emergency Medicine

## 2017-09-25 ENCOUNTER — Encounter (HOSPITAL_COMMUNITY): Payer: Self-pay | Admitting: Emergency Medicine

## 2017-09-25 DIAGNOSIS — W268XXA Contact with other sharp object(s), not elsewhere classified, initial encounter: Secondary | ICD-10-CM | POA: Insufficient documentation

## 2017-09-25 DIAGNOSIS — Z87891 Personal history of nicotine dependence: Secondary | ICD-10-CM | POA: Insufficient documentation

## 2017-09-25 DIAGNOSIS — Z91040 Latex allergy status: Secondary | ICD-10-CM | POA: Insufficient documentation

## 2017-09-25 DIAGNOSIS — Z23 Encounter for immunization: Secondary | ICD-10-CM | POA: Insufficient documentation

## 2017-09-25 DIAGNOSIS — L03113 Cellulitis of right upper limb: Secondary | ICD-10-CM | POA: Insufficient documentation

## 2017-09-25 MED ORDER — CLINDAMYCIN PHOSPHATE 600 MG/50ML IV SOLN
600.0000 mg | Freq: Once | INTRAVENOUS | Status: AC
  Administered 2017-09-25: 600 mg via INTRAVENOUS
  Filled 2017-09-25: qty 50

## 2017-09-25 MED ORDER — TETANUS-DIPHTH-ACELL PERTUSSIS 5-2.5-18.5 LF-MCG/0.5 IM SUSP
0.5000 mL | Freq: Once | INTRAMUSCULAR | Status: AC
  Administered 2017-09-25: 0.5 mL via INTRAMUSCULAR
  Filled 2017-09-25: qty 0.5

## 2017-09-25 MED ORDER — CLINDAMYCIN HCL 150 MG PO CAPS: 300.0000 mg | ORAL_CAPSULE | Freq: Four times a day (QID) | ORAL | 0 refills | Status: AC

## 2017-09-25 NOTE — ED Triage Notes (Signed)
Per pt, states she cut right palm on a piece of metal yesterday-states she woke up this am and there was a red streak going up inside of right forearm-went to [facility] and they told her come here

## 2017-09-25 NOTE — Discharge Instructions (Signed)
It was my pleasure taking care of you today!   Please take all of your antibiotics until finished!   Please return to the Emergency Department immediately if redness worsens, fever develops, new or worsening symptoms develop, any additional concerns.

## 2017-09-25 NOTE — ED Provider Notes (Signed)
[facility]-EMERGENCY DEPT Provider Note   CSN: [id number] Arrival date & time: 09/25/17  1328     History   Chief Complaint Chief Complaint  Patient presents with  . Extremity Laceration    HPI Jane Sensingngrid Sherman is a 41 y.o. female.  The history is provided by the patient and medical records. No language interpreter was used.   Jane Sensingngrid Sherman is a 41 y.o. female  with no pertinent PMH who presents to the Emergency Department complaining of laceration to the right palm which occurred yesterday morning. Patient states that she cut her hand on a piece of metal. The cut was very shallow. She cleaned it with soap and water, then put a band-aid on it. Today, she awoke and noticed redness around the area. As the day progressed, she felt as if the redness was continuing to spread up her arm, therefore went to urgent care for evaluation. Urgent care recommended she come to ED for further evaluation. Unsure of tetanus status. No fever, chills.   Past Medical History:  Diagnosis Date  . Abnormal Pap smear of cervix   . Contraceptive management 01/12/2014  . Gestational diabetes    with previous pregnancy  . Herpes 04/17/2014  . HPV (human papilloma virus) infection   . Labial irritation 01/12/2014   ?herpes  . Pelvic pain in pregnant patient at less than [redacted] weeks gestation 07/04/2014  . Pregnant state, incidental 07/04/2014    Patient Active Problem List   Diagnosis Date Noted  . Depression 05/14/2017  . Encounter for Nexplanon removal 05/14/2017  . Ectopic pregnancy 10/18/2016  . History of gestational diabetes 07/30/2014  . History of abnormal cervical Pap smear 07/30/2014  . Labial irritation 01/12/2014    Past Surgical History:  Procedure Laterality Date  . CERVICAL BIOPSY    . CESAREAN SECTION N/A 03/12/2015   Procedure: CESAREAN SECTION;  Surgeon: Levie HeritageJacob J Stinson, DO;  Location: [facility];  Service: Obstetrics;  Laterality: N/A;  . COLPOSCOPY   09/2013  . DIAGNOSTIC LAPAROSCOPY WITH REMOVAL OF ECTOPIC PREGNANCY N/A 10/18/2016   Procedure: DIAGNOSTIC LAPAROSCOPY WITH Removal Peritoneal Contents, Lysis of Adhesions, Peritoneal Lavage;  Surgeon: Lesly DukesKelly H Leggett, MD;  Location: [facility];  Service: Gynecology;  Laterality: N/A;  . TONSILLECTOMY      OB History    Gravida Para Term Preterm AB Living   5 2 2  0 3 2   SAB TAB Ectopic Multiple Live Births   2 0 1 0 2       Home Medications    Prior to Admission medications   Medication Sig Start Date End Date Taking? Authorizing Provider  clindamycin (CLEOCIN) 150 MG capsule Take 2 capsules (300 mg total) by mouth 4 (four) times daily. 09/25/17   Ward, Chase PicketJaime Pilcher, PA-C  escitalopram (LEXAPRO) 10 MG tablet Take 1 tablet (10 mg total) by mouth daily. Patient not taking: Reported on 07/08/2017 05/14/17   Adline PotterGriffin, Jennifer A, NP  valACYclovir (VALTREX) 1000 MG tablet TAKE ONE TABLET BY MOUTH ONCE DAILY Patient not taking: Reported on 07/08/2017 11/13/16   Adline PotterGriffin, Jennifer A, NP    Family History Family History  Problem Relation Age of Onset  . Diabetes Mother   . Diabetes Maternal Grandmother     Social History Social History  Substance Use Topics  . Smoking status: Former Smoker    Types: Cigarettes  . Smokeless tobacco: Never Used  . Alcohol use Yes     Comment: social     Allergies  Latex   Review of Systems Review of Systems  Constitutional: Negative for chills and fever.  Musculoskeletal: Negative for arthralgias and myalgias.  Skin: Positive for wound.     Physical Exam Updated Vital Signs There were no vitals taken for this visit.  Physical Exam  Constitutional: She appears well-developed and well-nourished. No distress.  HENT:  Head: Normocephalic and atraumatic.  Neck: Neck supple.  Cardiovascular: Normal rate, regular rhythm and normal heart sounds.   No murmur heard. Pulmonary/Chest: Effort normal and breath sounds normal. No respiratory  distress. She has no wheezes. She has no rales.  Musculoskeletal: Normal range of motion.  Neurological: She is alert.  Skin: Skin is warm and dry.  Right palm with 1.5 cm shallow laceration with erythema streaking up central palmar aspect of forearm approx. 7 cm.  Nursing note and vitals reviewed.    ED Treatments / Results  Labs (all labs ordered are listed, but only abnormal results are displayed) Labs Reviewed - No data to display  EKG  EKG Interpretation None       Radiology Dg Hand Complete Right  Result Date: 09/25/2017 CLINICAL DATA:  Per pt, states she cut right palm on a piece of metal yesterday-states she woke up this am and there was a red streak going up inside of right forearm; no previous injury EXAM: RIGHT HAND - COMPLETE 3+ VIEW COMPARISON:  None. FINDINGS: Osseous alignment is normal. Bone mineralization is normal. No fracture line or displaced fracture fragment seen. Soft tissues about the right hand are unremarkable. No radiodense foreign body appreciated within the soft tissues. No soft tissue gas seen. IMPRESSION: Negative. Electronically Signed   By: Bary Richard M.D.   On: 09/25/2017 16:40    Procedures Procedures (including critical care time)  Medications Ordered in ED Medications  clindamycin (CLEOCIN) IVPB 600 mg (600 mg Intravenous New Bag/Given 09/25/17 1626)  Tdap (BOOSTRIX) injection 0.5 mL (0.5 mLs Intramuscular Given 09/25/17 1628)     Initial Impression / Assessment and Plan / ED Course  I have reviewed the triage vital signs and the nursing notes.  Pertinent labs & imaging results that were available during my care of the patient were reviewed by me and considered in my medical decision making (see chart for details).    Jane Sherman is a 41 y.o. female who presents to ED for laceration > 24 hours ago which is quite superficial. This morning she developed redness concerning for infection. As the day progressed, erythema  worsened and is streaking approx. 7 cm up the palmar aspect of the forearm. X-ray negative. Afebrile, hemodynamically stable and non-toxic appearing. No evidence of foreign body. Full ROM without difficulty. Given dose of IV clindamycin in ED and will start on oral ABX for home. Spoke with the patient at length about strict return precautions. Area demarcated and dated in ED. Patient understands low threshold to return to ED and stressed to return immediately for worsening erythema or fever development. Patient understands and agrees with plan. All questions answered.   Final Clinical Impressions(s) / ED Diagnoses   Final diagnoses:  Cellulitis of right upper extremity    New Prescriptions New Prescriptions   CLINDAMYCIN (CLEOCIN) 150 MG CAPSULE    Take 2 capsules (300 mg total) by mouth 4 (four) times daily.     Ward, Chase Picket, PA-C 09/25/17 1704    Arby Barrette, MD 09/25/17 [phone]

## 2018-03-10 ENCOUNTER — Other Ambulatory Visit: Payer: Self-pay | Admitting: Adult Health

## 2018-11-29 ENCOUNTER — Emergency Department (HOSPITAL_COMMUNITY)
Admission: EM | Admit: 2018-11-29 | Discharge: 2018-11-29 | Disposition: A | Discharge status: Home / Self Care | Payer: Self-pay | Attending: Emergency Medicine | Admitting: Emergency Medicine

## 2018-11-29 ENCOUNTER — Encounter (HOSPITAL_COMMUNITY): Payer: Self-pay

## 2018-11-29 ENCOUNTER — Emergency Department (HOSPITAL_COMMUNITY): Payer: Self-pay

## 2018-11-29 DIAGNOSIS — Z87891 Personal history of nicotine dependence: Secondary | ICD-10-CM | POA: Insufficient documentation

## 2018-11-29 DIAGNOSIS — Z79899 Other long term (current) drug therapy: Secondary | ICD-10-CM | POA: Insufficient documentation

## 2018-11-29 DIAGNOSIS — N12 Tubulo-interstitial nephritis, not specified as acute or chronic: Secondary | ICD-10-CM | POA: Insufficient documentation

## 2018-11-29 LAB — URINALYSIS, ROUTINE W REFLEX MICROSCOPIC
BILIRUBIN URINE: NEGATIVE
Glucose, UA: NEGATIVE mg/dL
Ketones, ur: NEGATIVE mg/dL
Nitrite: NEGATIVE
Protein, ur: 30 mg/dL — AB
Specific Gravity, Urine: 1.008 (ref 1.005–1.030)
WBC, UA: >50 WBC/hpf — ABNORMAL HIGH (ref 0–5)
pH: 6 (ref 5.0–8.0)

## 2018-11-29 LAB — CBC
HCT: 41.4 % (ref 36.0–46.0)
Hemoglobin: 13.2 g/dL (ref 12.0–15.0)
MCH: 30.9 pg (ref 26.0–34.0)
MCHC: 31.9 g/dL (ref 30.0–36.0)
MCV: 97 fL (ref 80.0–100.0)
Platelets: 291 10*3/uL (ref 150–400)
RBC: 4.27 MIL/uL (ref 3.87–5.11)
RDW: 13.7 % (ref 11.5–15.5)
WBC: 10 10*3/uL (ref 4.0–10.5)
nRBC: 0 % (ref 0.0–0.2)

## 2018-11-29 LAB — COMPREHENSIVE METABOLIC PANEL
ALT: 53 U/L — ABNORMAL HIGH (ref 0–44)
AST: 35 U/L (ref 15–41)
Albumin: 4.1 g/dL (ref 3.5–5.0)
Alkaline Phosphatase: 187 U/L — ABNORMAL HIGH (ref 38–126)
Anion gap: 8 (ref 5–15)
BUN: 8 mg/dL (ref 6–20)
CO2: 26 mmol/L (ref 22–32)
Calcium: 9 mg/dL (ref 8.9–10.3)
Chloride: 103 mmol/L (ref 98–111)
Creatinine, Ser: 0.93 mg/dL (ref 0.44–1.00)
GFR calc Af Amer: >60 mL/min (ref >60)
GFR calc non Af Amer: >60 mL/min (ref >60)
Glucose, Bld: 93 mg/dL (ref 70–99)
Potassium: 3.7 mmol/L (ref 3.5–5.1)
Sodium: 137 mmol/L (ref 135–145)
Total Bilirubin: 2.3 mg/dL — ABNORMAL HIGH (ref 0.3–1.2)
Total Protein: 7.6 g/dL (ref 6.5–8.1)

## 2018-11-29 LAB — I-STAT BETA HCG BLOOD, ED (MC, WL, AP ONLY): I-stat hCG, quantitative: <5 m[IU]/mL (ref <5)

## 2018-11-29 LAB — LIPASE, BLOOD: Lipase: 33 U/L (ref 11–51)

## 2018-11-29 MED ORDER — ONDANSETRON 4 MG PO TBDP: ORAL_TABLET | ORAL | 0 refills | Status: AC

## 2018-11-29 MED ORDER — CEPHALEXIN 500 MG PO CAPS: 500.0000 mg | ORAL_CAPSULE | Freq: Four times a day (QID) | ORAL | 0 refills | Status: AC

## 2018-11-29 MED ORDER — IOPAMIDOL (ISOVUE-300) INJECTION 61%
INTRAVENOUS | Status: AC
  Filled 2018-11-29: qty 100

## 2018-11-29 MED ORDER — SODIUM CHLORIDE 0.9 % IV BOLUS
1000.0000 mL | Freq: Once | INTRAVENOUS | Status: AC
  Administered 2018-11-29: 1000 mL via INTRAVENOUS

## 2018-11-29 MED ORDER — KETOROLAC TROMETHAMINE 30 MG/ML IJ SOLN
15.0000 mg | Freq: Once | INTRAMUSCULAR | Status: AC
  Administered 2018-11-29: 15 mg via INTRAVENOUS
  Filled 2018-11-29: qty 1

## 2018-11-29 MED ORDER — ONDANSETRON HCL 4 MG/2ML IJ SOLN
4.0000 mg | Freq: Once | INTRAMUSCULAR | Status: AC
  Administered 2018-11-29: 4 mg via INTRAVENOUS
  Filled 2018-11-29: qty 2

## 2018-11-29 MED ORDER — SODIUM CHLORIDE (PF) 0.9 % IJ SOLN
INTRAMUSCULAR | Status: AC
  Filled 2018-11-29: qty 50

## 2018-11-29 MED ORDER — CEPHALEXIN 500 MG PO CAPS
1000.0000 mg | ORAL_CAPSULE | Freq: Once | ORAL | Status: AC
  Administered 2018-11-29: 1000 mg via ORAL
  Filled 2018-11-29: qty 2

## 2018-11-29 MED ORDER — IOPAMIDOL (ISOVUE-300) INJECTION 61%
100.0000 mL | Freq: Once | INTRAVENOUS | Status: AC | PRN
  Administered 2018-11-29: 100 mL via INTRAVENOUS

## 2018-11-29 MED ORDER — MORPHINE SULFATE (PF) 4 MG/ML IV SOLN
4.0000 mg | Freq: Once | INTRAVENOUS | Status: AC
  Administered 2018-11-29: 4 mg via INTRAVENOUS
  Filled 2018-11-29: qty 1

## 2018-11-29 NOTE — Discharge Instructions (Signed)
Return for worsening abdominal pain, inability to eat or drink 

## 2018-11-29 NOTE — ED Provider Notes (Signed)
[facility]-EMERGENCY DEPT Provider Note   CSN: [id number] Arrival date & time: 11/29/18  1354     History   Chief Complaint Chief Complaint  Patient presents with  . Abdominal Pain    HPI Jane Sherman is a 42 y.o. female.  42 yo F with a chief complaints of right-sided abdominal pain.  Going on for the past 3 days.  She has been afraid to eat and has not felt like it for the past couple days.  Had a low-grade fever last night and took Tylenol with some improvement.  Pain is worse when she goes over bumps in the car or lays back flat.  Denies urinary symptoms.  Denies trauma.  Has a C-section previously no other prior abdominal surgery.  The history is provided by the patient.  Abdominal Pain   This is a new problem. The current episode started more than 2 days ago. The problem occurs constantly. The problem has been gradually worsening. The pain is associated with an unknown factor. The pain is located in the RUQ. The quality of the pain is sharp and shooting. The pain is at a severity of 8/10. The pain is moderate. Associated symptoms include anorexia, fever and nausea. Pertinent negatives include vomiting, dysuria, headaches, arthralgias and myalgias. Nothing aggravates the symptoms. Nothing relieves the symptoms.    Past Medical History:  Diagnosis Date  . Abnormal Pap smear of cervix   . Contraceptive management 01/12/2014  . Gestational diabetes    with previous pregnancy  . Herpes 04/17/2014  . HPV (human papilloma virus) infection   . Labial irritation 01/12/2014   ?herpes  . Pelvic pain in pregnant patient at less than [redacted] weeks gestation 07/04/2014  . Pregnant state, incidental 07/04/2014    Patient Active Problem List   Diagnosis Date Noted  . Depression 05/14/2017  . Encounter for Nexplanon removal 05/14/2017  . Ectopic pregnancy 10/18/2016  . History of gestational diabetes 07/30/2014  . History of abnormal cervical Pap smear 07/30/2014   . Labial irritation 01/12/2014    Past Surgical History:  Procedure Laterality Date  . CERVICAL BIOPSY    . CESAREAN SECTION N/A 03/12/2015   Procedure: CESAREAN SECTION;  Surgeon: Levie Heritage, DO;  Location: [facility];  Service: Obstetrics;  Laterality: N/A;  . COLPOSCOPY  09/2013  . DIAGNOSTIC LAPAROSCOPY WITH REMOVAL OF ECTOPIC PREGNANCY N/A 10/18/2016   Procedure: DIAGNOSTIC LAPAROSCOPY WITH Removal Peritoneal Contents, Lysis of Adhesions, Peritoneal Lavage;  Surgeon: Lesly Dukes, MD;  Location: [facility];  Service: Gynecology;  Laterality: N/A;  . TONSILLECTOMY       OB History    Gravida  5   Para  2   Term  2   Preterm  0   AB  3   Living  2     SAB  2   TAB  0   Ectopic  1   Multiple  0   Live Births  2            Home Medications    Prior to Admission medications   Medication Sig Start Date End Date Taking? Authorizing Provider  acetaminophen (TYLENOL) 500 MG tablet Take 1,000 mg by mouth every 6 (six) hours as needed for moderate pain.   Yes [provider]  valACYclovir (VALTREX) 1000 MG tablet TAKE ONE TABLET BY MOUTH ONCE DAILY 03/10/18  Yes Cyril Mourning A, NP  cephALEXin (KEFLEX) 500 MG capsule Take 1 capsule (500 mg total) by  mouth 4 (four) times daily. 11/29/18   Melene PlanFloyd, Vennela Jutte, DO  clindamycin (CLEOCIN) 150 MG capsule Take 2 capsules (300 mg total) by mouth 4 (four) times daily. Patient not taking: Reported on 11/29/2018 09/25/17   Ward, Chase PicketJaime Pilcher, PA-C  escitalopram (LEXAPRO) 10 MG tablet Take 1 tablet (10 mg total) by mouth daily. Patient not taking: Reported on 07/08/2017 05/14/17   Adline PotterGriffin, Jennifer A, NP  ondansetron (ZOFRAN ODT) 4 MG disintegrating tablet 4mg  ODT q4 hours prn nausea/vomit 11/29/18   Melene PlanFloyd, Joline Encalada, DO    Family History Family History  Problem Relation Age of Onset  . Diabetes Mother   . Diabetes Maternal Grandmother     Social History Social History   Tobacco Use  . Smoking status: Former Smoker     Types: Cigarettes  . Smokeless tobacco: Never Used  Substance Use Topics  . Alcohol use: Yes    Comment: social  . Drug use: No     Allergies   Latex   Review of Systems Review of Systems  Constitutional: Positive for fever. Negative for chills.  HENT: Negative for congestion and rhinorrhea.   Eyes: Negative for redness and visual disturbance.  Respiratory: Negative for shortness of breath and wheezing.   Cardiovascular: Negative for chest pain and palpitations.  Gastrointestinal: Positive for abdominal pain, anorexia and nausea. Negative for vomiting.  Genitourinary: Negative for dysuria and urgency.  Musculoskeletal: Negative for arthralgias and myalgias.  Skin: Negative for pallor and wound.  Neurological: Negative for dizziness and headaches.     Physical Exam Updated Vital Signs BP 116/81   Pulse 93   Temp 98 F (36.7 C) (Oral)   Resp 18   Ht 5\' 6"  (1.676 m)   Wt 83.9 kg   SpO2 100%   BMI 29.86 kg/m   Physical Exam Vitals signs and nursing note reviewed.  Constitutional:      General: She is not in acute distress.    Appearance: She is well-developed. She is not diaphoretic.  HENT:     Head: Normocephalic and atraumatic.  Eyes:     Pupils: Pupils are equal, round, and reactive to light.  Neck:     Musculoskeletal: Normal range of motion and neck supple.  Cardiovascular:     Rate and Rhythm: Normal rate and regular rhythm.     Heart sounds: No murmur. No friction rub. No gallop.   Pulmonary:     Effort: Pulmonary effort is normal.     Breath sounds: No wheezing or rales.  Abdominal:     General: There is no distension.     Palpations: Abdomen is soft.     Tenderness: There is abdominal tenderness in the right lower quadrant. Positive signs include McBurney's sign. Negative signs include Murphy's sign.  Musculoskeletal:        General: No tenderness.  Skin:    General: Skin is warm and dry.  Neurological:     Mental Status: She is alert and  oriented to person, place, and time.  Psychiatric:        Behavior: Behavior normal.      ED Treatments / Results  Labs (all labs ordered are listed, but only abnormal results are displayed) Labs Reviewed  COMPREHENSIVE METABOLIC PANEL - Abnormal; Notable for the following components:      Result Value   ALT 53 (*)    Alkaline Phosphatase 187 (*)    Total Bilirubin 2.3 (*)    All other components within normal limits  URINALYSIS,  ROUTINE W REFLEX MICROSCOPIC - Abnormal; Notable for the following components:   APPearance HAZY (*)    Hgb urine dipstick SMALL (*)    Protein, ur 30 (*)    Leukocytes, UA LARGE (*)    WBC, UA >50 (*)    Bacteria, UA FEW (*)    All other components within normal limits  LIPASE, BLOOD  CBC  I-STAT BETA HCG BLOOD, ED ([facility], [facility], [facility] ONLY)    EKG None  Radiology Ct Abdomen Pelvis W Contrast  Result Date: 11/29/2018 CLINICAL DATA:  Right flank pain EXAM: CT ABDOMEN AND PELVIS WITH CONTRAST TECHNIQUE: Multidetector CT imaging of the abdomen and pelvis was performed using the standard protocol following bolus administration of intravenous contrast. CONTRAST:  100mL ISOVUE-300 IOPAMIDOL (ISOVUE-300) INJECTION 61% COMPARISON:  None. FINDINGS: Lower chest: Lung bases demonstrate no acute consolidation or effusion. The heart size is normal Hepatobiliary: Punctate stone in the gallbladder. No focal hepatic abnormality or biliary dilatation Pancreas: Unremarkable. No pancreatic ductal dilatation or surrounding inflammatory changes. Spleen: Normal in size without focal abnormality. Adrenals/Urinary Tract: Adrenal glands are normal. Urothelial enhancement of the right ureter and pelvis. Mild right perinephric fat stranding. Bladder unremarkable Stomach/Bowel: Stomach is within normal limits. Appendix appears normal. No evidence of bowel wall thickening, distention, or inflammatory changes. Vascular/Lymphatic: No significant vascular findings are present. No enlarged  abdominal or pelvic lymph nodes. Reproductive: Uterus and bilateral adnexa are unremarkable. Other: No free air.  Small free fluid in the pelvis Musculoskeletal: No acute or significant osseous findings. IMPRESSION: 1. Mild right perinephric edema. Urothelial enhancement of right renal pelvis and ureter, consistent with ascending urinary tract infection and probable pyelonephritis. 2. Negative for acute appendicitis 3. Small amount of free fluid in the pelvis Electronically Signed   By: Jasmine PangKim  Fujinaga M.D.   On: 11/29/2018 19:21    Procedures Procedures (including critical care time)  Medications Ordered in ED Medications  iopamidol (ISOVUE-300) 61 % injection (has no administration in time range)  sodium chloride (PF) 0.9 % injection (has no administration in time range)  cephALEXin (KEFLEX) capsule 1,000 mg (has no administration in time range)  ketorolac (TORADOL) 30 MG/ML injection 15 mg (has no administration in time range)  morphine 4 MG/ML injection 4 mg (4 mg Intravenous Given 11/29/18 1817)  ondansetron (ZOFRAN) injection 4 mg (4 mg Intravenous Given 11/29/18 1817)  sodium chloride 0.9 % bolus 1,000 mL (1,000 mLs Intravenous New Bag/Given 11/29/18 1817)  iopamidol (ISOVUE-300) 61 % injection 100 mL (100 mLs Intravenous Contrast Given 11/29/18 1851)     Initial Impression / Assessment and Plan / ED Course  I have reviewed the triage vital signs and the nursing notes.  Pertinent labs & imaging results that were available during my care of the patient were reviewed by me and considered in my medical decision making (see chart for details).     42 yo F with a cc of R sided abdominal pain.  Points to right upper side.  My exam with pain worse in the right lower than right upper quadrant.  Negative Murphy sign.  Will obtain a CT scan.  She has very minimal LFT elevation.  No white count.  Lipase is normal.  Patient reassessed and feeling much better.  Repeat abdominal exam with  significant improvement.  CT scan with visualized appendix with no inflammation.  Gallbladder with a punctate stone but no other concerning findings.  Her urine is concerning for possible UTI though she does have some squamous cells.  The right kidney does appear to have some inflammation per the radiologist.  Will treat as pyelonephritis.  PCP follow-up.  7:31 PM:  I have discussed the diagnosis/risks/treatment options with the patient and family and believe the pt to be eligible for discharge home to follow-up with PCP. We also discussed returning to the ED immediately if new or worsening sx occur. We discussed the sx which are most concerning (e.g., sudden worsening pain, fever, inability to tolerate by mouth) that necessitate immediate return. Medications administered to the patient during their visit and any new prescriptions provided to the patient are listed below.  Medications given during this visit Medications  iopamidol (ISOVUE-300) 61 % injection (has no administration in time range)  sodium chloride (PF) 0.9 % injection (has no administration in time range)  cephALEXin (KEFLEX) capsule 1,000 mg (has no administration in time range)  ketorolac (TORADOL) 30 MG/ML injection 15 mg (has no administration in time range)  morphine 4 MG/ML injection 4 mg (4 mg Intravenous Given 11/29/18 1817)  ondansetron (ZOFRAN) injection 4 mg (4 mg Intravenous Given 11/29/18 1817)  sodium chloride 0.9 % bolus 1,000 mL (1,000 mLs Intravenous New Bag/Given 11/29/18 1817)  iopamidol (ISOVUE-300) 61 % injection 100 mL (100 mLs Intravenous Contrast Given 11/29/18 1851)     The patient appears reasonably screen and/or stabilized for discharge and I doubt any other medical condition or other [facility] requiring further screening, evaluation, or treatment in the ED at this time prior to discharge.    Final Clinical Impressions(s) / ED Diagnoses   Final diagnoses:  Pyelonephritis    ED Discharge Orders          Ordered    cephALEXin (KEFLEX) 500 MG capsule  4 times daily     11/29/18 1929    ondansetron (ZOFRAN ODT) 4 MG disintegrating tablet     11/29/18 1929           Melene Plan, DO 11/29/18 1931

## 2018-11-29 NOTE — ED Notes (Signed)
Patient ambulated to restroom with minimal assistance. ?

## 2018-11-29 NOTE — ED Triage Notes (Signed)
Pt presents with c/o abdominal pain for the past 3 days, radiating to her back. Pt is holding her right flank in triage. Pt reports some nausea but no vomiting. Pt also reports she had a fever last night which resolved with Tylenol.

## 2018-11-29 NOTE — ED Notes (Signed)
Pt presents to ER complaining of right flank pain x 3 days. Pt endorses nausea, chills, abdominal pain, and discomfort while urinating. Pt denies CP, Sob, vomiting, or bowel changes at this time. Pt is alert and oriented. Skin is warm and dry. Respirations are regular even and unlabored. Pt is able to speak in complete sentences without difficulty.

## 2019-05-30 ENCOUNTER — Other Ambulatory Visit: Payer: Self-pay

## 2019-05-30 DIAGNOSIS — Z20822 Contact with and (suspected) exposure to covid-19: Secondary | ICD-10-CM

## 2019-06-03 LAB — NOVEL CORONAVIRUS, NAA: SARS-CoV-2, NAA: NOT DETECTED

## 2019-10-10 ENCOUNTER — Other Ambulatory Visit: Payer: Self-pay | Admitting: Adult Health

## 2020-12-14 ENCOUNTER — Other Ambulatory Visit: Payer: Medicaid Other

## 2020-12-14 DIAGNOSIS — Z20822 Contact with and (suspected) exposure to covid-19: Secondary | ICD-10-CM

## 2020-12-17 LAB — NOVEL CORONAVIRUS, NAA: SARS-CoV-2, NAA: DETECTED — AB

## 2021-02-09 ENCOUNTER — Other Ambulatory Visit: Payer: Self-pay | Admitting: Adult Health

## 2021-02-17 ENCOUNTER — Ambulatory Visit: Payer: Self-pay

## 2021-02-18 ENCOUNTER — Other Ambulatory Visit: Payer: Self-pay

## 2021-02-18 ENCOUNTER — Ambulatory Visit (INDEPENDENT_AMBULATORY_CARE_PROVIDER_SITE_OTHER): Payer: Medicaid Other | Admitting: Obstetrics and Gynecology

## 2021-02-18 ENCOUNTER — Encounter: Payer: Self-pay | Admitting: Obstetrics and Gynecology

## 2021-02-18 VITALS — BP 123/67 | HR 68 | Ht 65.0 in | Wt 204.3 lb

## 2021-02-18 DIAGNOSIS — Z30011 Encounter for initial prescription of contraceptive pills: Secondary | ICD-10-CM | POA: Diagnosis not present

## 2021-02-18 DIAGNOSIS — Z3009 Encounter for other general counseling and advice on contraception: Secondary | ICD-10-CM

## 2021-02-18 DIAGNOSIS — Z8742 Personal history of other diseases of the female genital tract: Secondary | ICD-10-CM | POA: Diagnosis not present

## 2021-02-18 MED ORDER — LO LOESTRIN FE 1 MG-10 MCG / 10 MCG PO TABS: 1.0000 | ORAL_TABLET | Freq: Every day | ORAL | 0 refills | Status: AC

## 2021-02-18 NOTE — Progress Notes (Signed)
HPI:      [name] is a 45 y.o. [phone] who LMP was No LMP recorded (lmp unknown).  Subjective:   She presents today because she would like some type of birth control.  She recently had an abortion for an unintended pregnancy.  This occurred approximately 2 months ago and she is still spotting.  She initially thought she would like to use Depo-Provera for birth control but changed her mind to OCPs.  She is also considering IUD but wants to do more "research". Patient describes a remote history of abnormal Pap smear and colposcopically directed biopsies.  She has not had a Pap in several years.    Hx: The following portions of the patient's history were reviewed and updated as appropriate:             She  has a past medical history of Abnormal Pap smear of cervix, Contraceptive management (01/12/2014), Gestational diabetes, Herpes (04/17/2014), HPV (human papilloma virus) infection, Labial irritation (01/12/2014), Pelvic pain in pregnant patient at less than [redacted] weeks gestation (07/04/2014), and Pregnant state, incidental (07/04/2014). She does not have any pertinent problems on file. She  has a past surgical history that includes Colposcopy (09/2013); Cervical biopsy; Tonsillectomy; Cesarean section (N/A, 03/12/2015); and Diagnostic laparoscopy with removal of ectopic pregnancy (N/A, 10/18/2016). Her family history includes Diabetes in her maternal grandmother and mother. She  reports that she has quit smoking. Her smoking use included cigarettes. She has never used smokeless tobacco. She reports current alcohol use. She reports that she does not use drugs. She has a current medication list which includes the following prescription(s): lo loestrin fe, valacyclovir, acetaminophen, cephalexin, clindamycin, escitalopram, and ondansetron. She is allergic to latex.       Review of Systems:  Review of Systems  Constitutional: Denied constitutional symptoms, night sweats, recent illness,  fatigue, fever, insomnia and weight loss.  Eyes: Denied eye symptoms, eye pain, photophobia, vision change and visual disturbance.  Ears/Nose/Throat/Neck: Denied ear, nose, throat or neck symptoms, hearing loss, nasal discharge, sinus congestion and sore throat.  Cardiovascular: Denied cardiovascular symptoms, arrhythmia, chest pain/pressure, edema, exercise intolerance, orthopnea and palpitations.  Respiratory: Denied pulmonary symptoms, asthma, pleuritic pain, productive sputum, cough, dyspnea and wheezing.  Gastrointestinal: Denied, gastro-esophageal reflux, melena, nausea and vomiting.  Genitourinary: Denied genitourinary symptoms including symptomatic vaginal discharge, pelvic relaxation issues, and urinary complaints.  Musculoskeletal: Denied musculoskeletal symptoms, stiffness, swelling, muscle weakness and myalgia.  Dermatologic: Denied dermatology symptoms, rash and scar.  Neurologic: Denied neurology symptoms, dizziness, headache, neck pain and syncope.  Psychiatric: Denied psychiatric symptoms, anxiety and depression.  Endocrine: Denied endocrine symptoms including hot flashes and night sweats.   Meds:   Current Outpatient Medications on File Prior to Visit  Medication Sig Dispense Refill  . valACYclovir (VALTREX) 1000 MG tablet Take 1 tablet by mouth once daily 30 tablet 3  . acetaminophen (TYLENOL) 500 MG tablet Take 1,000 mg by mouth every 6 (six) hours as needed for moderate pain. (Patient not taking: Reported on 02/18/2021)    . cephALEXin (KEFLEX) 500 MG capsule Take 1 capsule (500 mg total) by mouth 4 (four) times daily. (Patient not taking: Reported on 02/18/2021) 20 capsule 0  . clindamycin (CLEOCIN) 150 MG capsule Take 2 capsules (300 mg total) by mouth 4 (four) times daily. (Patient not taking: No sig reported) 56 capsule 0  . escitalopram (LEXAPRO) 10 MG tablet Take 1 tablet (10 mg total) by mouth daily. (Patient not taking: No sig reported) 30 tablet 6  .  ondansetron  (ZOFRAN ODT) 4 MG disintegrating tablet 4mg  ODT q4 hours prn nausea/vomit (Patient not taking: Reported on 02/18/2021) 20 tablet 0   No current facility-administered medications on file prior to visit.       The pregnancy intention screening data noted above was reviewed. Potential methods of contraception were discussed. The patient elected to proceed with Oral Contraceptive.     Objective:     Vitals:   02/18/21 0855  BP: 123/67  Pulse: 68   Filed Weights   02/18/21 0855  Weight: 204 lb 4.8 oz (92.7 kg)                Assessment:    02/20/21 Patient Active Problem List   Diagnosis Date Noted  . Depression 05/14/2017  . Encounter for Nexplanon removal 05/14/2017  . Ectopic pregnancy 10/18/2016  . History of gestational diabetes 07/30/2014  . History of abnormal cervical Pap smear 07/30/2014  . Labial irritation 01/12/2014     1. Birth control counseling   2. Initiation of OCP ([facility])   3. History of abnormal cervical Pap smear        Plan:            1.  Birth Control I discussed multiple birth control options and methods with the patient.  The risks and benefits of each were reviewed. OCPs The risks /benefits of OCPs have been explained to the patient in detail.  Product literature has been given to her where appropriate.  I have instructed her in the use of OCPs.  I have explained to the patient that OCPs are not as effective for birth control during the first month of use, and that another form of contraception should be used during this time.  Both first-day start and Sunday start have been explained.  The risks and benefits of each was discussed.  She has been made aware of  the fact that in rare circumstances, other medications may affect the efficacy of OCPs.  I have answered all of her questions, and I believe that she has an understanding of the effectiveness and use of OCPs. 2.  Patient also considering IUD-will inform Thursday if she changes her mind. 3.   Recommended patient have a Pap smear in the very near future.  We will contact [facility] to try to get this arranged.  I have discussed the importance of this with the patient and have recommended that she seek care at the health department or somewhere else where a Pap can be performed if [facility] does not contact her.  Orders No orders of the defined types were placed in this encounter.    Meds ordered this encounter  Medications  . Norethindrone-Ethinyl Estradiol-Fe Biphas (LO LOESTRIN FE) 1 MG-10 MCG / 10 MCG tablet    Sig: Take 1 tablet by mouth at bedtime for 28 days.    Dispense:  84 tablet    Refill:  0      F/U  No follow-ups on file. I spent 31 minutes involved in the care of this patient preparing to see the patient by obtaining and reviewing her medical history (including labs, imaging tests and prior procedures), documenting clinical information in the electronic health record (EHR), counseling and coordinating care plans, writing and sending prescriptions, ordering tests or procedures and directly communicating with the patient by discussing pertinent items from her history and physical exam as well as detailing my assessment and plan as noted above so that she has an informed understanding.  All of her questions were answered.  Elonda Husky, M.D. 02/18/2021 9:19 AM

## 2021-03-12 ENCOUNTER — Ambulatory Visit
Admission: RE | Admit: 2021-03-12 | Discharge: 2021-03-12 | Disposition: A | Discharge status: Home / Self Care | Payer: Medicaid Other | Source: Ambulatory Visit | Attending: Oncology | Admitting: Oncology

## 2021-03-12 ENCOUNTER — Encounter: Payer: Self-pay | Admitting: *Deleted

## 2021-03-12 ENCOUNTER — Ambulatory Visit: Payer: Medicaid Other | Attending: Oncology | Admitting: *Deleted

## 2021-03-12 ENCOUNTER — Other Ambulatory Visit: Payer: Self-pay

## 2021-03-12 VITALS — BP 116/73 | HR 72 | Temp 96.3°F | Ht 65.0 in | Wt 194.5 lb

## 2021-03-12 DIAGNOSIS — Z0000 Encounter for general adult medical examination without abnormal findings: Secondary | ICD-10-CM | POA: Insufficient documentation

## 2021-03-12 NOTE — Patient Instructions (Signed)
Gave patient hand-out, Women Staying Healthy, Active and Well from [facility], with education on breast health, pap smears, heart and colon health. 

## 2021-03-12 NOTE — Progress Notes (Signed)
Subjective:     Patient ID: Jane Sherman, female   DOB: 05-23-1976, 45 y.o.   MRN: [id number]  HPI   [facility] Medical History Record - 03/12/21 1093      Breast History   Screening cycle New    Provider ([facility]) none    Initial Mammogram 03/12/21    Last Mammogram Never    Recent Breast Symptoms None      Breast Cancer History   Breast Cancer History No personal or family history      Previous History of Breast Problems   Breast Surgery or Biopsy None    Breast Implants N/A    BSE Done Monthly      Gynecological/Obstetrical History   LMP 02/26/21    Is there any chance that the client could be pregnant?  No    Age at menarche 45    Age at menopause n/a    PAP smear history Annually    Date of last PAP  11/11/16    Provider (PAP) unknown    Age at first live birth 41    Breast fed children --   a week   DES Exposure No    Cervical, Uterine or Ovarian cancer No    Family history of Cervial, Uterine or Ovarian cancer No    Hysterectomy No    Cervix removed No    Ovaries removed No    Laser/Cryosurgery --   LSIL 2014 with colposcopy   Current method of birth control Pill    Current method of Estrogen/Hormone replacement None    Smoking history None    Comments family planning Medicaid             Review of Systems     Objective:   Physical Exam Chest:  Breasts:     Right: No swelling, bleeding, inverted nipple, mass, nipple discharge, skin change, tenderness, axillary adenopathy or supraclavicular adenopathy.     Left: No swelling, bleeding, inverted nipple, mass, nipple discharge, skin change, tenderness, axillary adenopathy or supraclavicular adenopathy.     Abdominal:     Palpations: There is no hepatomegaly or splenomegaly.    Genitourinary:    Labia:        Right: No rash, tenderness, lesion or injury.        Left: No rash, tenderness, lesion or injury.      Urethra: No prolapse, urethral pain, urethral swelling or urethral lesion.      Vagina: No signs of injury and foreign body. Vaginal discharge present. No erythema, tenderness, bleeding, lesions or prolapsed vaginal walls.     Cervix: No cervical motion tenderness, discharge, friability, lesion, erythema, cervical bleeding or eversion.     Uterus: Not deviated, not enlarged and not fixed.      Adnexa:        Right: Mass present.        Left: No mass.       Rectum: No mass.     Comments: Non-odorous mass Lymphadenopathy:     Upper Body:     Right upper body: No supraclavicular or axillary adenopathy.     Left upper body: No supraclavicular or axillary adenopathy.        Assessment:     45 year old English speaking Hispanic female referred to [facility] by Dr. Logan Bores for baseline mammogram and pap smear.   Clinical breast exam unremarkable.  Taught self breast awareness.  Specimen collected for pap smear without difficulty.  Patient has been screened for  eligibility.  She does not have any insurance, Medicare or Medicaid.  She also meets financial eligibility.   Risk Assessment    Risk Scores      03/12/2021   Last edited by: Scarlett Presto, RN   5-year risk: 0.4 %   Lifetime risk: 6.2 %            Plan:     Screening mammogram ordered.  Specimen for pap sent to the lab.  Will follow up per [facility] protocol.

## 2021-03-17 LAB — IGP, APTIMA HPV: HPV Aptima: NEGATIVE

## 2021-03-18 ENCOUNTER — Encounter: Payer: Self-pay | Admitting: *Deleted

## 2021-03-18 NOTE — Progress Notes (Signed)
Letter mailed to inform patient of her normal mammogram and pap smear.  Next mammo due in 1 year and pap due in 5 years.

## 2021-05-27 ENCOUNTER — Encounter: Payer: Medicaid Other | Admitting: Obstetrics and Gynecology

## 2021-06-12 ENCOUNTER — Encounter: Payer: Self-pay | Admitting: Obstetrics and Gynecology

## 2021-06-24 ENCOUNTER — Encounter: Payer: Self-pay | Admitting: Obstetrics and Gynecology

## 2021-06-24 ENCOUNTER — Encounter: Payer: Medicaid Other | Admitting: Obstetrics and Gynecology

## 2021-08-12 IMAGING — MG MM DIGITAL SCREENING BILAT W/ TOMO AND CAD
8 series · 8 of 24 positions shown · non-contrast
Comparison: None.

CLINICAL DATA: Screening.

EXAM:
DIGITAL SCREENING BILATERAL MAMMOGRAM WITH TOMOSYNTHESIS AND CAD
TECHNIQUE: Bilateral screening digital craniocaudal and mediolateral oblique
mammograms were obtained. Bilateral screening digital breast
tomosynthesis was performed. The images were evaluated with
computer-aided detection.

[R CC synth-2D]
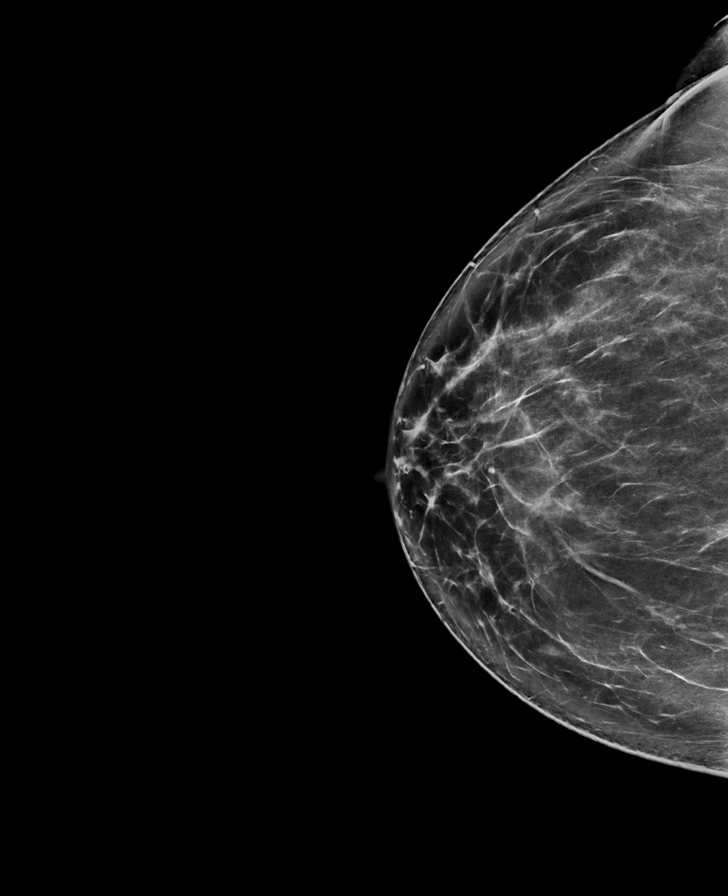

[L MLO synth-2D]
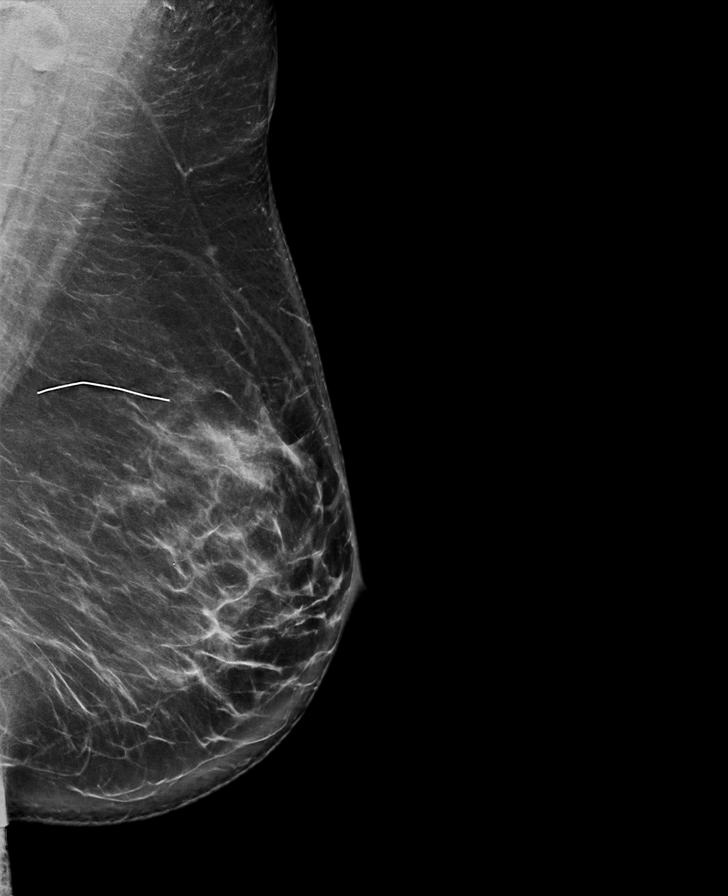

[R MLO synth-2D]
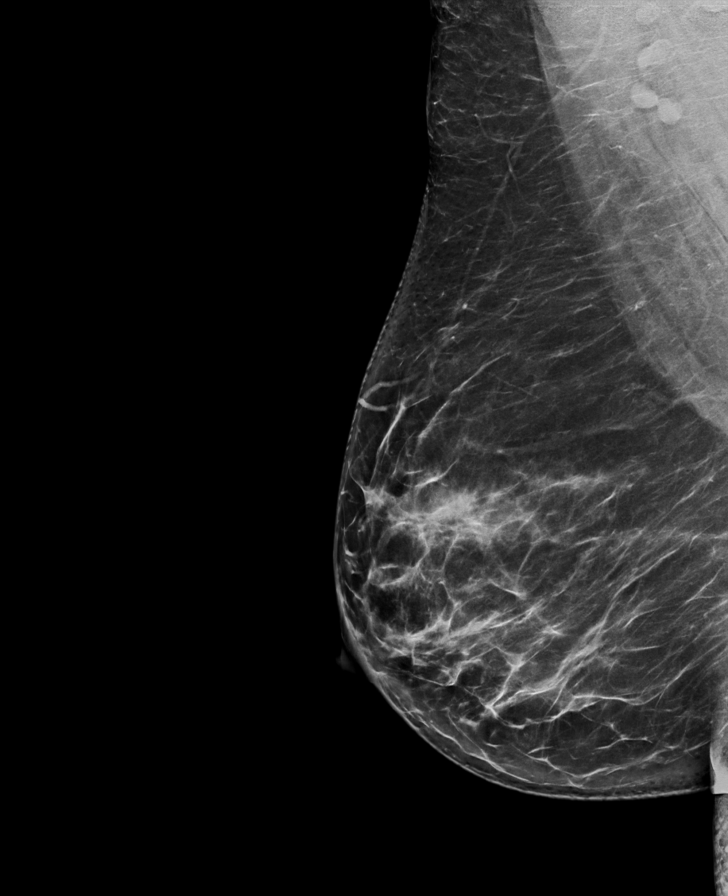

[L CC synth-2D]
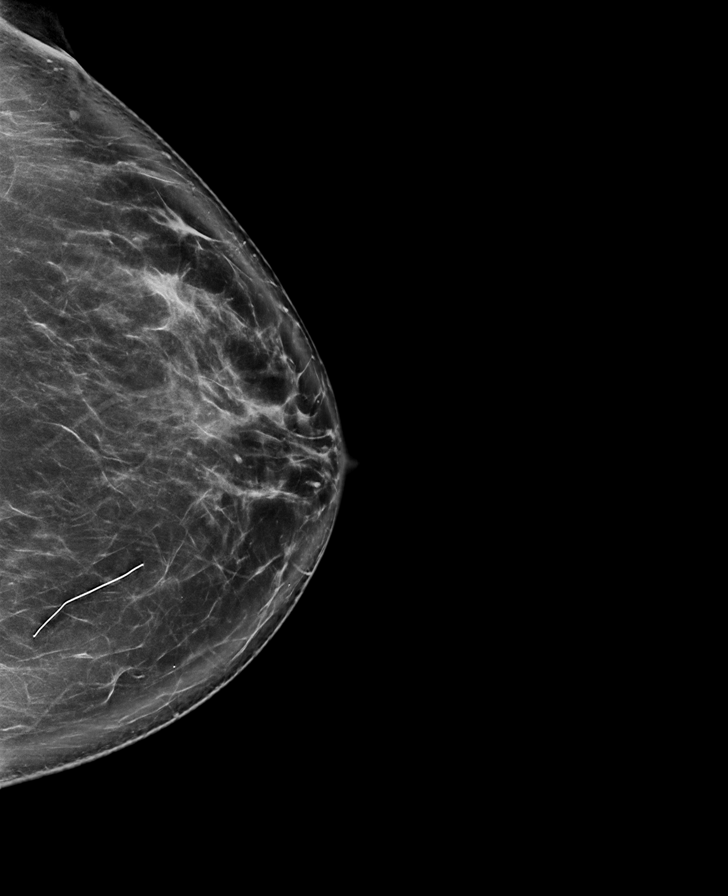

[L MLO tomo · tomo slice 53/106.0]
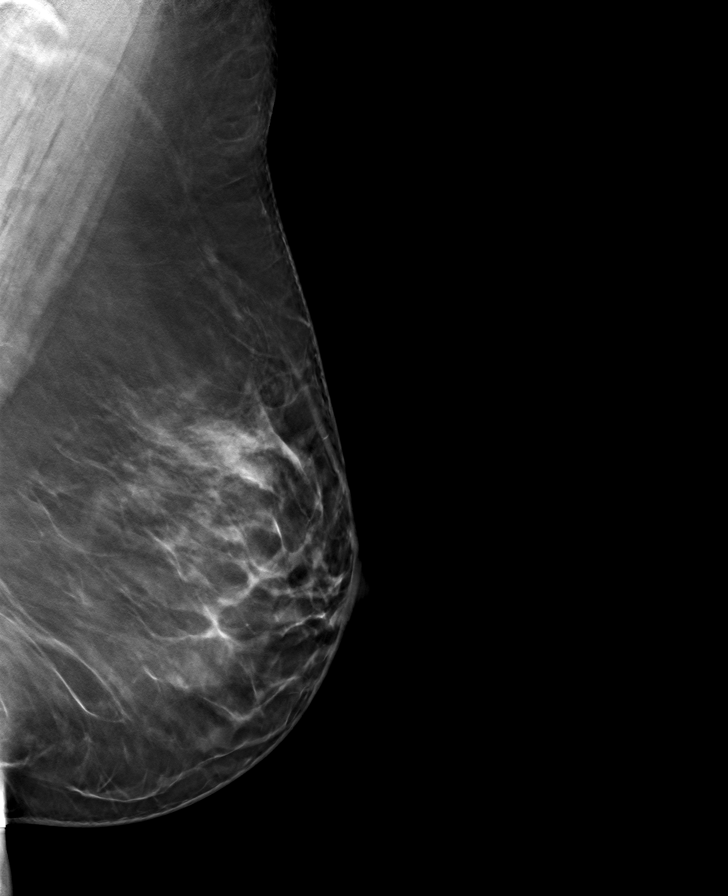

[R MLO tomo · tomo slice 47/92.0]
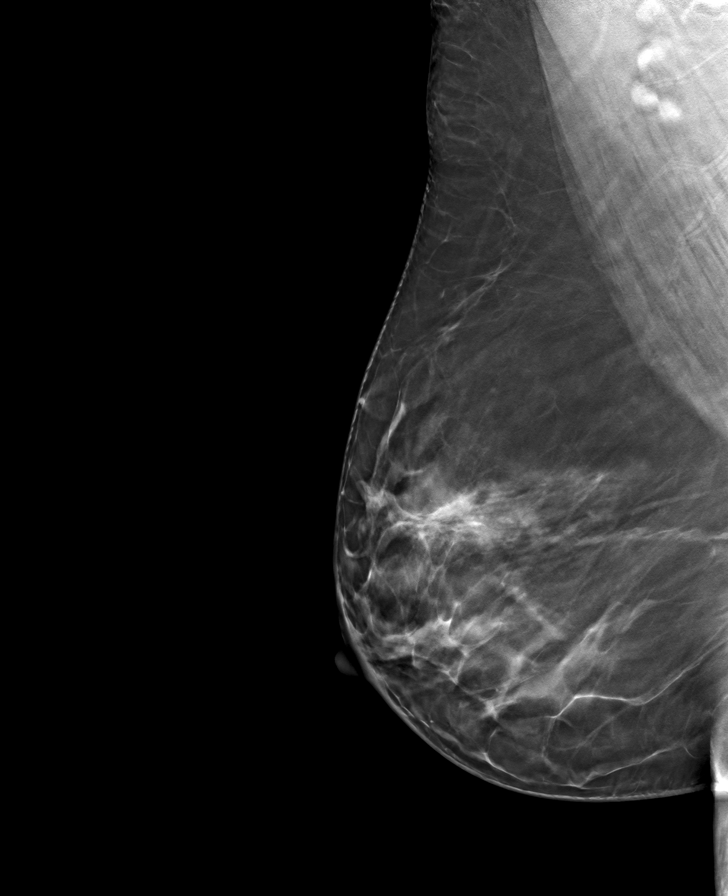

[L CC tomo · tomo slice 51/100.0]
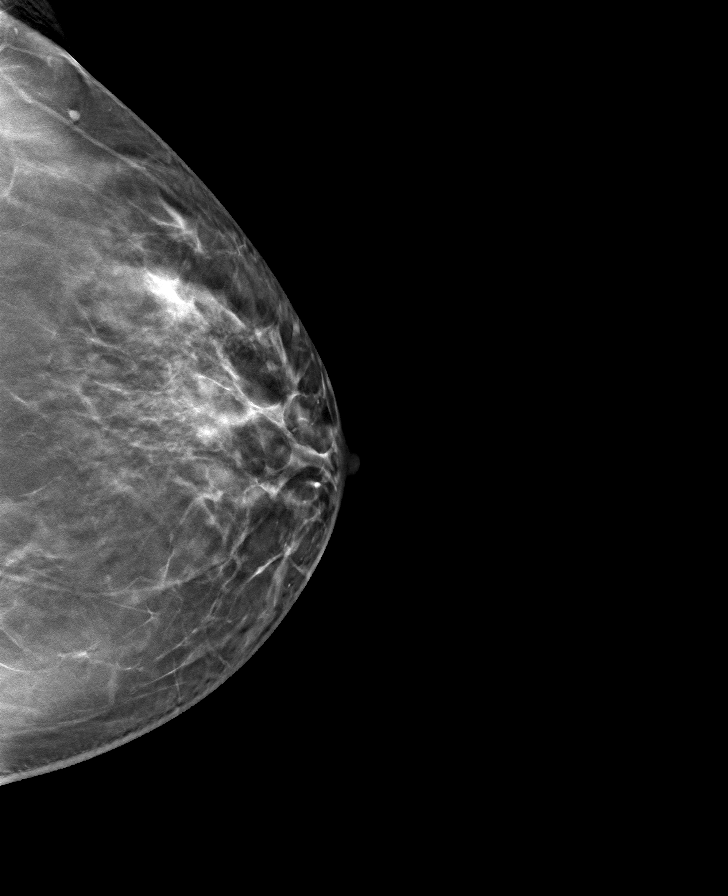

[R CC tomo · tomo slice 43/85.0]
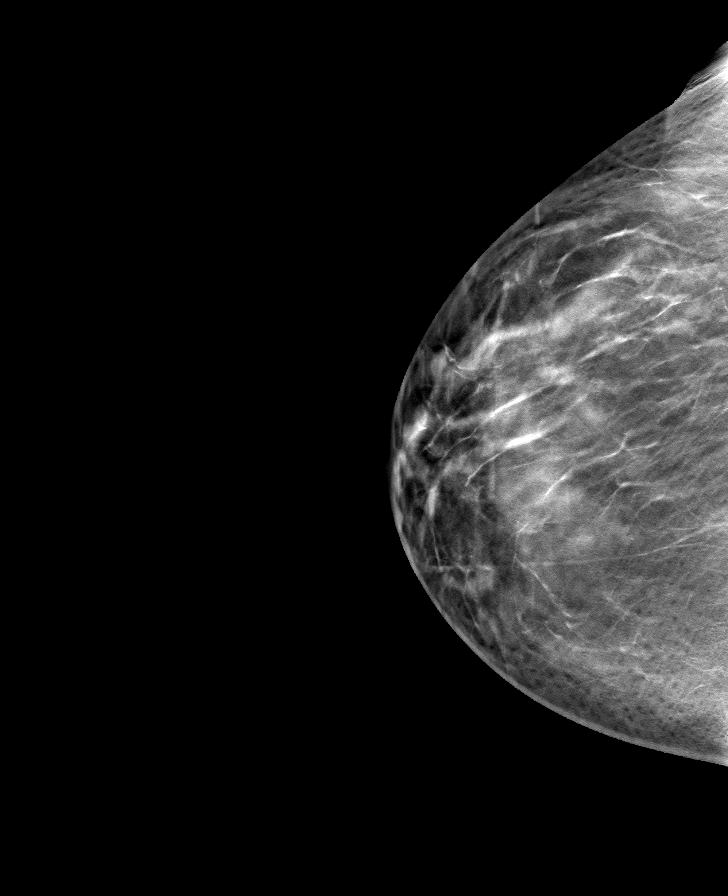

[8 of 24 positions shown; findings below may reference images not displayed]

ACR Breast Density Category b: There are scattered areas of
fibroglandular density.
FINDINGS: There are no findings suspicious for malignancy. The images were
evaluated with computer-aided detection.
IMPRESSION: No mammographic evidence of malignancy. A result letter of this
screening mammogram will be mailed directly to the patient.

RECOMMENDATION:
Screening mammogram in one year. (Code:C7-6-ASJ)

BI-RADS CATEGORY  1: Negative.

## 2022-06-16 ENCOUNTER — Other Ambulatory Visit: Payer: Self-pay | Admitting: Adult Health

## 2023-02-04 ENCOUNTER — Ambulatory Visit
Admission: RE | Admit: 2023-02-04 | Discharge: 2023-02-04 | Disposition: A | Discharge status: Home / Self Care | Payer: Self-pay | Source: Ambulatory Visit | Attending: Nurse Practitioner | Admitting: Nurse Practitioner

## 2023-02-04 VITALS — BP 120/87 | HR 65 | Temp 98.4°F | Resp 16

## 2023-02-04 DIAGNOSIS — Z23 Encounter for immunization: Secondary | ICD-10-CM

## 2023-02-04 DIAGNOSIS — S61112A Laceration without foreign body of left thumb with damage to nail, initial encounter: Secondary | ICD-10-CM

## 2023-02-04 MED ORDER — CEPHALEXIN 500 MG PO CAPS: 500.0000 mg | ORAL_CAPSULE | Freq: Four times a day (QID) | ORAL | 0 refills | Status: AC

## 2023-02-04 MED ORDER — TETANUS-DIPHTH-ACELL PERTUSSIS 5-2.5-18.5 LF-MCG/0.5 IM SUSY
0.5000 mL | PREFILLED_SYRINGE | Freq: Once | INTRAMUSCULAR | Status: AC
  Administered 2023-02-04: 0.5 mL via INTRAMUSCULAR

## 2023-06-05 ENCOUNTER — Other Ambulatory Visit: Payer: Self-pay | Admitting: Adult Health

## 2024-07-08 ENCOUNTER — Other Ambulatory Visit: Payer: Self-pay | Admitting: Adult Health
